# Patient Record
Sex: Female | Born: 1935 | Race: Black or African American | Hispanic: No | Smoking: Never smoker
Health system: Southern US, Community
[De-identification: ages and names within clinical notes are randomized; demographics above are authoritative.]

## PROBLEM LIST (undated history)

## (undated) DIAGNOSIS — F0390 Unspecified dementia without behavioral disturbance: Secondary | ICD-10-CM

## (undated) DIAGNOSIS — I209 Angina pectoris, unspecified: Secondary | ICD-10-CM

## (undated) DIAGNOSIS — I219 Acute myocardial infarction, unspecified: Secondary | ICD-10-CM

## (undated) DIAGNOSIS — K5792 Diverticulitis of intestine, part unspecified, without perforation or abscess without bleeding: Secondary | ICD-10-CM

## (undated) DIAGNOSIS — K219 Gastro-esophageal reflux disease without esophagitis: Secondary | ICD-10-CM

## (undated) DIAGNOSIS — I10 Essential (primary) hypertension: Secondary | ICD-10-CM

## (undated) DIAGNOSIS — F209 Schizophrenia, unspecified: Secondary | ICD-10-CM

## (undated) DIAGNOSIS — J449 Chronic obstructive pulmonary disease, unspecified: Secondary | ICD-10-CM

## (undated) HISTORY — PX: CARDIAC CATHETERIZATION: SHX172

## (undated) HISTORY — PX: POLYPECTOMY: SHX149

## (undated) HISTORY — PX: CHOLECYSTECTOMY: SHX55

## (undated) HISTORY — PX: ABDOMINAL HYSTERECTOMY: SHX81

---

## 1998-08-05 ENCOUNTER — Encounter: Payer: Self-pay | Admitting: Emergency Medicine

## 1998-08-05 ENCOUNTER — Emergency Department (HOSPITAL_COMMUNITY): Admission: EM | Admit: 1998-08-05 | Discharge: 1998-08-05 | Payer: Self-pay | Admitting: Emergency Medicine

## 2003-05-24 ENCOUNTER — Encounter: Admission: RE | Admit: 2003-05-24 | Discharge: 2003-05-24 | Payer: Self-pay | Admitting: Internal Medicine

## 2003-05-24 ENCOUNTER — Encounter: Payer: Self-pay | Admitting: Internal Medicine

## 2004-12-08 ENCOUNTER — Emergency Department (HOSPITAL_COMMUNITY): Admission: EM | Admit: 2004-12-08 | Discharge: 2004-12-08 | Payer: Self-pay | Admitting: Emergency Medicine

## 2006-04-17 IMAGING — CR DG CHEST 1V PORT
1 series · 1 of 1 positions shown · non-contrast
Comparison: None.

CLINICAL DATA: Chest pain. 
 CHEST PORTABLE ? 1 VIEW:

[view not recorded]
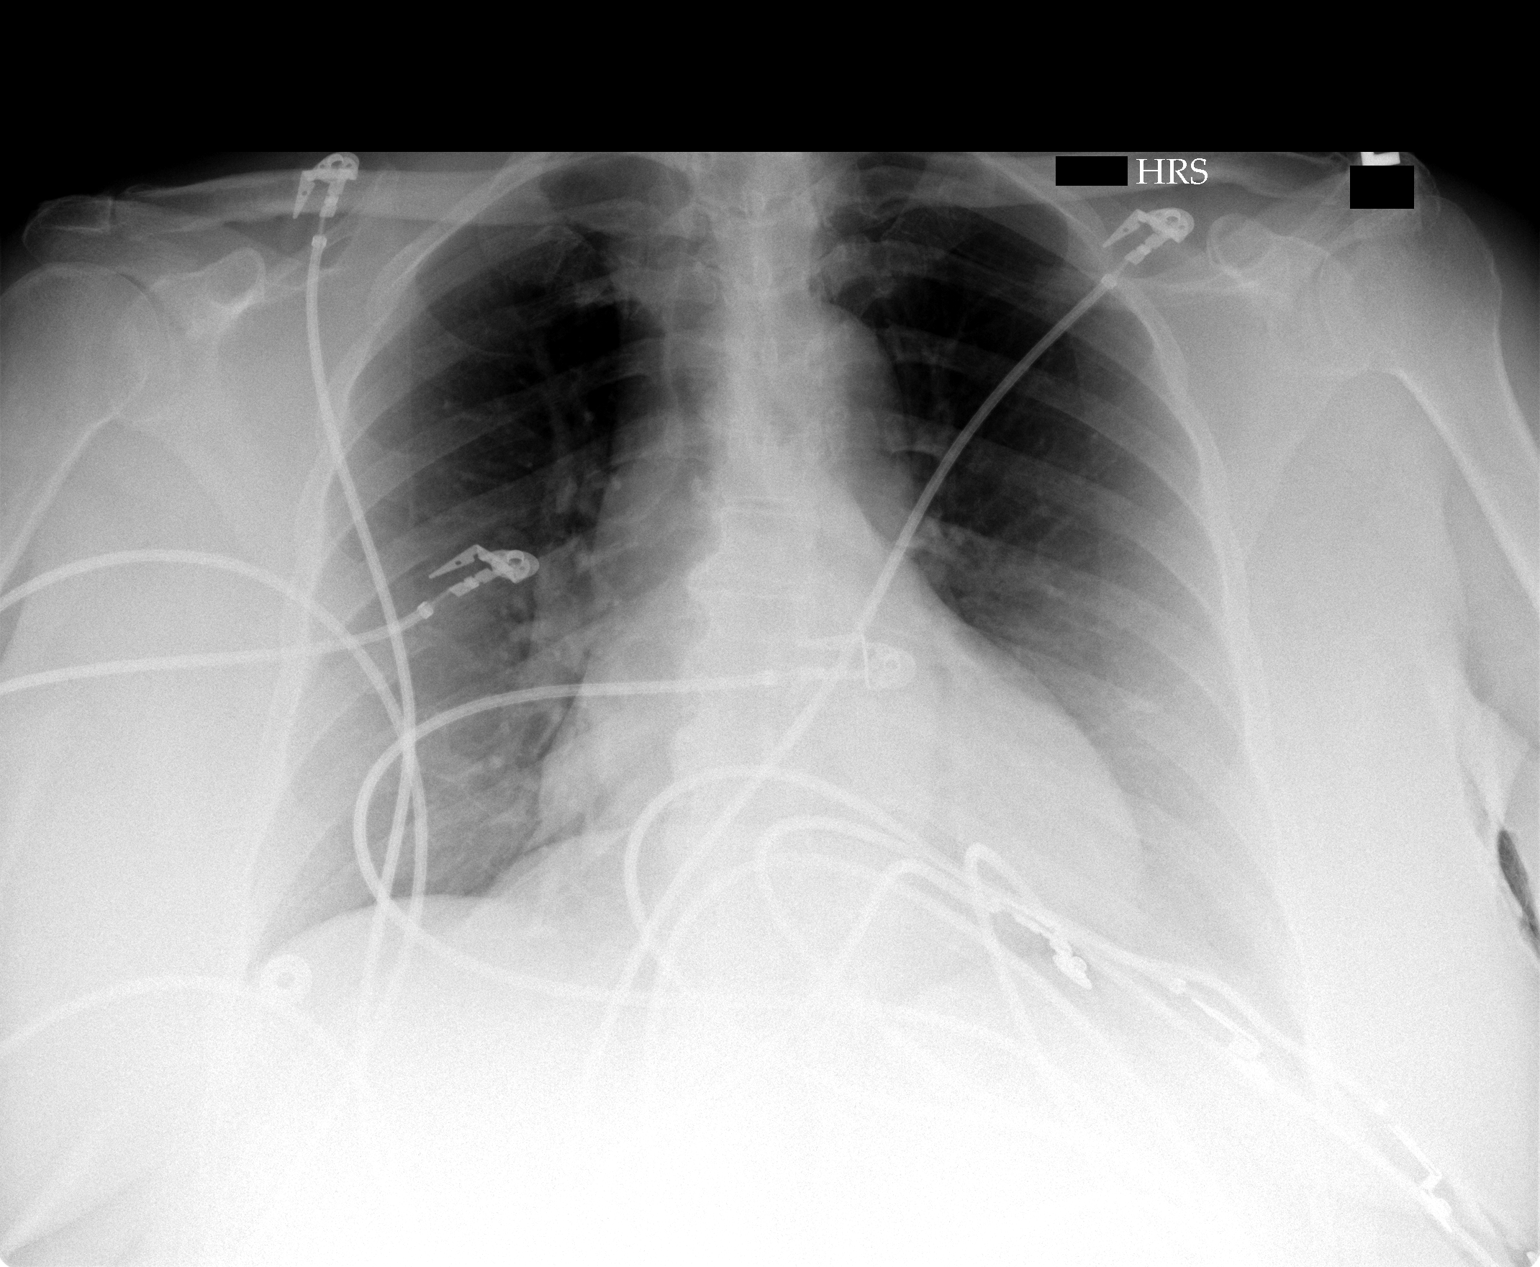

[1 of 1 positions shown; findings below may reference images not displayed]

FINDINGS: Heart appears to be within normal limits for portable technique.  No congestive heart failure or active disease.
IMPRESSION: Within normal limits for a portable chest x-ray.

## 2015-09-04 ENCOUNTER — Observation Stay (HOSPITAL_COMMUNITY)
Admission: EM | Admit: 2015-09-04 | Discharge: 2015-09-06 | Disposition: A | Discharge status: Home / Self Care | Payer: Medicare Other | Attending: Internal Medicine | Admitting: Internal Medicine

## 2015-09-04 ENCOUNTER — Emergency Department (HOSPITAL_COMMUNITY): Payer: Medicare Other

## 2015-09-04 ENCOUNTER — Encounter (HOSPITAL_COMMUNITY): Payer: Self-pay | Admitting: Vascular Surgery

## 2015-09-04 ENCOUNTER — Emergency Department (HOSPITAL_BASED_OUTPATIENT_CLINIC_OR_DEPARTMENT_OTHER): Payer: Medicare Other

## 2015-09-04 DIAGNOSIS — M79609 Pain in unspecified limb: Secondary | ICD-10-CM

## 2015-09-04 DIAGNOSIS — I10 Essential (primary) hypertension: Secondary | ICD-10-CM | POA: Insufficient documentation

## 2015-09-04 DIAGNOSIS — R0789 Other chest pain: Secondary | ICD-10-CM | POA: Diagnosis not present

## 2015-09-04 DIAGNOSIS — R1319 Other dysphagia: Secondary | ICD-10-CM | POA: Diagnosis not present

## 2015-09-04 DIAGNOSIS — K5792 Diverticulitis of intestine, part unspecified, without perforation or abscess without bleeding: Secondary | ICD-10-CM | POA: Insufficient documentation

## 2015-09-04 DIAGNOSIS — I209 Angina pectoris, unspecified: Secondary | ICD-10-CM | POA: Diagnosis not present

## 2015-09-04 DIAGNOSIS — M7989 Other specified soft tissue disorders: Secondary | ICD-10-CM

## 2015-09-04 DIAGNOSIS — I252 Old myocardial infarction: Secondary | ICD-10-CM | POA: Diagnosis not present

## 2015-09-04 DIAGNOSIS — R05 Cough: Secondary | ICD-10-CM | POA: Diagnosis not present

## 2015-09-04 DIAGNOSIS — R0602 Shortness of breath: Secondary | ICD-10-CM | POA: Diagnosis not present

## 2015-09-04 DIAGNOSIS — R600 Localized edema: Secondary | ICD-10-CM | POA: Diagnosis not present

## 2015-09-04 DIAGNOSIS — R059 Cough, unspecified: Secondary | ICD-10-CM | POA: Insufficient documentation

## 2015-09-04 DIAGNOSIS — I2510 Atherosclerotic heart disease of native coronary artery without angina pectoris: Secondary | ICD-10-CM | POA: Diagnosis not present

## 2015-09-04 DIAGNOSIS — R079 Chest pain, unspecified: Secondary | ICD-10-CM | POA: Diagnosis present

## 2015-09-04 DIAGNOSIS — K219 Gastro-esophageal reflux disease without esophagitis: Secondary | ICD-10-CM | POA: Insufficient documentation

## 2015-09-04 DIAGNOSIS — F0390 Unspecified dementia without behavioral disturbance: Secondary | ICD-10-CM | POA: Diagnosis not present

## 2015-09-04 DIAGNOSIS — R071 Chest pain on breathing: Secondary | ICD-10-CM

## 2015-09-04 DIAGNOSIS — R2243 Localized swelling, mass and lump, lower limb, bilateral: Secondary | ICD-10-CM | POA: Insufficient documentation

## 2015-09-04 DIAGNOSIS — Z880 Allergy status to penicillin: Secondary | ICD-10-CM | POA: Insufficient documentation

## 2015-09-04 DIAGNOSIS — M940 Chondrocostal junction syndrome [Tietze]: Secondary | ICD-10-CM

## 2015-09-04 DIAGNOSIS — F0280 Dementia in other diseases classified elsewhere without behavioral disturbance: Secondary | ICD-10-CM

## 2015-09-04 DIAGNOSIS — F411 Generalized anxiety disorder: Secondary | ICD-10-CM

## 2015-09-04 DIAGNOSIS — I509 Heart failure, unspecified: Secondary | ICD-10-CM

## 2015-09-04 DIAGNOSIS — G3183 Dementia with Lewy bodies: Secondary | ICD-10-CM

## 2015-09-04 HISTORY — DX: Gastro-esophageal reflux disease without esophagitis: K21.9

## 2015-09-04 HISTORY — DX: Unspecified dementia, unspecified severity, without behavioral disturbance, psychotic disturbance, mood disturbance, and anxiety: F03.90

## 2015-09-04 HISTORY — DX: Essential (primary) hypertension: I10

## 2015-09-04 HISTORY — DX: Angina pectoris, unspecified: I20.9

## 2015-09-04 HISTORY — DX: Diverticulitis of intestine, part unspecified, without perforation or abscess without bleeding: K57.92

## 2015-09-04 HISTORY — DX: Acute myocardial infarction, unspecified: I21.9

## 2015-09-04 LAB — BASIC METABOLIC PANEL
ANION GAP: 6 (ref 5–15)
BUN: 10 mg/dL (ref 6–20)
CALCIUM: 8.8 mg/dL — AB (ref 8.9–10.3)
CHLORIDE: 107 mmol/L (ref 101–111)
CO2: 28 mmol/L (ref 22–32)
Creatinine, Ser: 0.98 mg/dL (ref 0.44–1.00)
GFR calc non Af Amer: 53 mL/min — ABNORMAL LOW (ref >60)
Glucose, Bld: 111 mg/dL — ABNORMAL HIGH (ref 65–99)
POTASSIUM: 4 mmol/L (ref 3.5–5.1)
Sodium: 141 mmol/L (ref 135–145)

## 2015-09-04 LAB — URINALYSIS, ROUTINE W REFLEX MICROSCOPIC
Glucose, UA: NEGATIVE mg/dL
Hgb urine dipstick: NEGATIVE
KETONES UR: NEGATIVE mg/dL
LEUKOCYTES UA: NEGATIVE
NITRITE: NEGATIVE
PH: 5.5 (ref 5.0–8.0)
PROTEIN: NEGATIVE mg/dL
Specific Gravity, Urine: 1.025 (ref 1.005–1.030)

## 2015-09-04 LAB — CBC
HEMATOCRIT: 39.3 % (ref 36.0–46.0)
Hemoglobin: 13 g/dL (ref 12.0–15.0)
MCH: 32.4 pg (ref 26.0–34.0)
MCHC: 33.1 g/dL (ref 30.0–36.0)
MCV: 98 fL (ref 78.0–100.0)
Platelets: 241 10*3/uL (ref 150–400)
RBC: 4.01 MIL/uL (ref 3.87–5.11)
RDW: 15.7 % — AB (ref 11.5–15.5)
WBC: 5.5 10*3/uL (ref 4.0–10.5)

## 2015-09-04 LAB — TROPONIN I

## 2015-09-04 LAB — I-STAT TROPONIN, ED: Troponin i, poc: 0 ng/mL (ref 0.00–0.08)

## 2015-09-04 LAB — BRAIN NATRIURETIC PEPTIDE: B Natriuretic Peptide: 58.7 pg/mL (ref 0.0–100.0)

## 2015-09-04 MED ORDER — GUAIFENESIN ER 600 MG PO TB12
600.0000 mg | ORAL_TABLET | Freq: Two times a day (BID) | ORAL | Status: DC
  Administered 2015-09-04 – 2015-09-06 (×4): 600 mg via ORAL
  Filled 2015-09-04 (×4): qty 1

## 2015-09-04 MED ORDER — ASPIRIN EC 81 MG PO TBEC
81.0000 mg | DELAYED_RELEASE_TABLET | Freq: Every day | ORAL | Status: DC
  Administered 2015-09-05 – 2015-09-06 (×2): 81 mg via ORAL
  Filled 2015-09-04 (×3): qty 1

## 2015-09-04 MED ORDER — ALPRAZOLAM 0.5 MG PO TABS
1.0000 mg | ORAL_TABLET | Freq: Two times a day (BID) | ORAL | Status: DC | PRN
  Administered 2015-09-05 – 2015-09-06 (×3): 1 mg via ORAL
  Filled 2015-09-04 (×3): qty 2

## 2015-09-04 MED ORDER — ADULT MULTIVITAMIN W/MINERALS CH
1.0000 | ORAL_TABLET | Freq: Every day | ORAL | Status: DC
  Administered 2015-09-05 – 2015-09-06 (×2): 1 via ORAL
  Filled 2015-09-04 (×2): qty 1

## 2015-09-04 MED ORDER — DAILY VITE PO TABS: 1.0000 | ORAL_TABLET | Freq: Every day | ORAL | Status: DC

## 2015-09-04 MED ORDER — ENOXAPARIN SODIUM 40 MG/0.4ML ~~LOC~~ SOLN
40.0000 mg | SUBCUTANEOUS | Status: DC
  Administered 2015-09-04 – 2015-09-05 (×2): 40 mg via SUBCUTANEOUS
  Filled 2015-09-04 (×2): qty 0.4

## 2015-09-04 MED ORDER — TRAZODONE HCL 100 MG PO TABS
100.0000 mg | ORAL_TABLET | Freq: Every day | ORAL | Status: DC
  Administered 2015-09-04 – 2015-09-05 (×2): 100 mg via ORAL
  Filled 2015-09-04 (×2): qty 1

## 2015-09-04 MED ORDER — MORPHINE SULFATE (PF) 2 MG/ML IV SOLN
1.0000 mg | INTRAVENOUS | Status: DC | PRN
  Administered 2015-09-04: 1 mg via INTRAVENOUS
  Filled 2015-09-04: qty 1

## 2015-09-04 MED ORDER — ONDANSETRON HCL 4 MG/2ML IJ SOLN: 4.0000 mg | Freq: Four times a day (QID) | INTRAMUSCULAR | Status: DC | PRN

## 2015-09-04 MED ORDER — FUROSEMIDE 10 MG/ML IJ SOLN
20.0000 mg | Freq: Four times a day (QID) | INTRAMUSCULAR | Status: DC
  Administered 2015-09-05 – 2015-09-06 (×5): 20 mg via INTRAVENOUS
  Filled 2015-09-04 (×6): qty 2

## 2015-09-04 MED ORDER — ESTROGENS CONJUGATED 0.625 MG PO TABS
0.6250 mg | ORAL_TABLET | Freq: Every day | ORAL | Status: DC
  Administered 2015-09-05: 0.625 mg via ORAL
  Filled 2015-09-04 (×3): qty 1

## 2015-09-04 MED ORDER — MORPHINE SULFATE (PF) 4 MG/ML IV SOLN
4.0000 mg | Freq: Once | INTRAVENOUS | Status: AC
  Administered 2015-09-04: 4 mg via INTRAVENOUS
  Filled 2015-09-04: qty 1

## 2015-09-04 MED ORDER — FUROSEMIDE 10 MG/ML IJ SOLN
20.0000 mg | Freq: Once | INTRAMUSCULAR | Status: AC
  Administered 2015-09-04: 20 mg via INTRAVENOUS
  Filled 2015-09-04: qty 2

## 2015-09-04 MED ORDER — POTASSIUM CHLORIDE CRYS ER 10 MEQ PO TBCR
10.0000 meq | EXTENDED_RELEASE_TABLET | Freq: Every day | ORAL | Status: DC
  Administered 2015-09-05 – 2015-09-06 (×2): 10 meq via ORAL
  Filled 2015-09-04 (×3): qty 1

## 2015-09-04 MED ORDER — BENZONATATE 100 MG PO CAPS
200.0000 mg | ORAL_CAPSULE | ORAL | Status: AC
  Administered 2015-09-04: 200 mg via ORAL
  Filled 2015-09-04: qty 2

## 2015-09-04 MED ORDER — MONTELUKAST SODIUM 10 MG PO TABS
10.0000 mg | ORAL_TABLET | Freq: Every day | ORAL | Status: DC
  Administered 2015-09-04 – 2015-09-05 (×2): 10 mg via ORAL
  Filled 2015-09-04 (×2): qty 1

## 2015-09-04 MED ORDER — IPRATROPIUM-ALBUTEROL 0.5-2.5 (3) MG/3ML IN SOLN
3.0000 mL | RESPIRATORY_TRACT | Status: DC | PRN
  Filled 2015-09-04: qty 3

## 2015-09-04 MED ORDER — OXYBUTYNIN CHLORIDE 5 MG PO TABS
5.0000 mg | ORAL_TABLET | Freq: Every day | ORAL | Status: DC
  Administered 2015-09-05 – 2015-09-06 (×2): 5 mg via ORAL
  Filled 2015-09-04 (×2): qty 1

## 2015-09-04 MED ORDER — ATORVASTATIN CALCIUM 20 MG PO TABS
20.0000 mg | ORAL_TABLET | Freq: Every evening | ORAL | Status: DC
  Administered 2015-09-04 – 2015-09-05 (×2): 20 mg via ORAL
  Filled 2015-09-04 (×2): qty 1

## 2015-09-04 MED ORDER — IPRATROPIUM-ALBUTEROL 0.5-2.5 (3) MG/3ML IN SOLN
3.0000 mL | Freq: Three times a day (TID) | RESPIRATORY_TRACT | Status: DC
  Administered 2015-09-05 – 2015-09-06 (×4): 3 mL via RESPIRATORY_TRACT
  Filled 2015-09-04 (×5): qty 3

## 2015-09-04 MED ORDER — BENZONATATE 100 MG PO CAPS
200.0000 mg | ORAL_CAPSULE | Freq: Two times a day (BID) | ORAL | Status: DC
  Administered 2015-09-04 – 2015-09-06 (×4): 200 mg via ORAL
  Filled 2015-09-04 (×4): qty 2

## 2015-09-04 MED ORDER — HYDROCODONE-ACETAMINOPHEN 5-325 MG PO TABS
1.0000 | ORAL_TABLET | Freq: Four times a day (QID) | ORAL | Status: DC | PRN
  Administered 2015-09-04 – 2015-09-05 (×3): 1 via ORAL
  Filled 2015-09-04 (×3): qty 1

## 2015-09-04 MED ORDER — DICLOFENAC SODIUM 1 % TD GEL
4.0000 g | Freq: Four times a day (QID) | TRANSDERMAL | Status: DC
  Administered 2015-09-04 – 2015-09-06 (×6): 4 g via TOPICAL
  Filled 2015-09-04: qty 100

## 2015-09-04 MED ORDER — IPRATROPIUM-ALBUTEROL 0.5-2.5 (3) MG/3ML IN SOLN
3.0000 mL | Freq: Four times a day (QID) | RESPIRATORY_TRACT | Status: DC
  Administered 2015-09-04: 3 mL via RESPIRATORY_TRACT

## 2015-09-04 MED ORDER — FOLIC ACID 1 MG PO TABS
1.0000 mg | ORAL_TABLET | Freq: Every day | ORAL | Status: DC
  Administered 2015-09-05 – 2015-09-06 (×2): 1 mg via ORAL
  Filled 2015-09-04 (×2): qty 1

## 2015-09-04 MED ORDER — ASPIRIN 325 MG PO TABS
325.0000 mg | ORAL_TABLET | Freq: Once | ORAL | Status: AC
  Administered 2015-09-04: 325 mg via ORAL
  Filled 2015-09-04: qty 1

## 2015-09-04 MED ORDER — MELOXICAM 7.5 MG PO TABS
7.5000 mg | ORAL_TABLET | Freq: Every day | ORAL | Status: DC
  Administered 2015-09-05: 7.5 mg via ORAL
  Filled 2015-09-04: qty 1

## 2015-09-04 MED ORDER — TAMSULOSIN HCL 0.4 MG PO CAPS
0.4000 mg | ORAL_CAPSULE | Freq: Every day | ORAL | Status: DC
  Administered 2015-09-05: 0.4 mg via ORAL
  Filled 2015-09-04: qty 1

## 2015-09-04 MED ORDER — GI COCKTAIL ~~LOC~~
30.0000 mL | Freq: Four times a day (QID) | ORAL | Status: DC | PRN
  Administered 2015-09-04: 30 mL via ORAL
  Filled 2015-09-04: qty 30

## 2015-09-04 MED ORDER — PANTOPRAZOLE SODIUM 40 MG PO TBEC
40.0000 mg | DELAYED_RELEASE_TABLET | Freq: Every day | ORAL | Status: DC
  Administered 2015-09-05 – 2015-09-06 (×2): 40 mg via ORAL
  Filled 2015-09-04 (×2): qty 1

## 2015-09-04 MED ORDER — ACETAMINOPHEN 325 MG PO TABS: 650.0000 mg | ORAL_TABLET | ORAL | Status: DC | PRN

## 2015-09-04 NOTE — ED Notes (Signed)
Admitting at bedside 

## 2015-09-04 NOTE — ED Notes (Signed)
Pt provided with meal

## 2015-09-04 NOTE — ED Notes (Signed)
Pt reports to the ED for eval of bilateral lower leg swelling that began yesterday. Pt reports some SOB with minimal exertion. Reports some midsternal chest pain that is intermittent in nature. Per daughter pt had a cardiac cath without stent placement x 6 weeks ago. Pts family denies any hx of CHF. Pt alert and oriented at baseline. Resp e/u and skin warm and dry.

## 2015-09-04 NOTE — ED Provider Notes (Signed)
CSN: [id number]     Arrival date & time 09/04/15  1457 History   First MD Initiated Contact with Patient 09/04/15 1704     Chief Complaint  Patient presents with  . Leg Swelling     (Consider location/radiation/quality/duration/timing/severity/associated sxs/prior Treatment) The history is provided by the patient.  Courtney Oneal is a 81 y.o. female hx of dementia, HTN, MI here with chest pain, shortness of breath, leg swelling. [place] came [place]. Patient states that she was in the hospital about 6 weeks ago and had a heart attack. Upon review of her records, patient had NSTEMI. Patient had a cardiac cath but no stents were placed. Patient came here 3 days ago. Has worsening chest pain for the last 2 days. Pain worse with exertion and she gets short of breath with exertion as well. Also worsening leg swelling bilaterally. No hx of DVT    Past Medical History  Diagnosis Date  . Dementia   . Hypertension   . MI (myocardial infarction) (HCC)   . GERD (gastroesophageal reflux disease)   . Diverticulitis   . Angina pectoris [facility](HCC)    Past Surgical History  Procedure Laterality Date  . Cardiac catheterization    . Cholecystectomy    . Abdominal hysterectomy    . Polypectomy     History reviewed. No pertinent family history. Social History  Substance Use Topics  . Smoking status: Never Smoker   . Smokeless tobacco: Never Used  . Alcohol Use: No   OB History    No data available     Review of Systems  Respiratory: Positive for shortness of breath.   Cardiovascular: Positive for chest pain.  All other systems reviewed and are negative.     Allergies  Penicillins and Sulfa antibiotics  Home Medications   Prior to Admission medications   Not on File   BP 120/76 mmHg  Pulse 62  Temp(Src) 97.9 F (36.6 C) (Oral)  Resp 17  SpO2 97% Physical Exam  Constitutional: She is oriented to person, place, and time.  Chronically ill   HENT:  Head:  Normocephalic.  Mouth/Throat: Oropharynx is clear and moist.  Eyes: Conjunctivae are normal. Pupils are equal, round, and reactive to light.  Neck: Normal range of motion. Neck supple.  Cardiovascular: Normal rate, regular rhythm and normal heart sounds.   Pulmonary/Chest: Effort normal.  Diminished bilaterally, no wheezing   Abdominal: Soft. Bowel sounds are normal. She exhibits no distension. There is no tenderness. There is no rebound.  Musculoskeletal:  2+ edema bilaterally, bilateral calf tenderness   Neurological: She is alert and oriented to person, place, and time.  Skin: Skin is warm and dry.  Psychiatric: She has a normal mood and affect. Her behavior is normal. Judgment and thought content normal.  Nursing note and vitals reviewed.   ED Course  Procedures (including critical care time) Labs Review Labs Reviewed  BASIC METABOLIC PANEL - Abnormal; Notable for the following:    Glucose, Bld 111 (*)    Calcium 8.8 (*)    GFR calc non Af Amer 53 (*)    All other components within normal limits  CBC - Abnormal; Notable for the following:    RDW 15.7 (*)    All other components within normal limits  URINALYSIS, ROUTINE W REFLEX MICROSCOPIC (NOT AT [facility]) - Abnormal; Notable for the following:    Color, Urine AMBER (*)    Bilirubin Urine SMALL (*)    All other  components within normal limits  BRAIN NATRIURETIC PEPTIDE  I-STAT TROPOININ, ED    Imaging Review Dg Chest 2 View  09/04/2015  CLINICAL DATA:  Shortness of breath for 2 days.  Initial encounter. EXAM: CHEST  2 VIEW COMPARISON:  Single view of the chest 12/08/2004. PA and lateral chest 05/24/2003. FINDINGS: There is cardiomegaly without edema. The lungs are clear. No pneumothorax or pleural effusion. IMPRESSION: Cardiomegaly without acute disease. Electronically Signed   By: Drusilla Kanner M.D.   On: 09/04/2015 16:09   I have personally reviewed and evaluated these images and lab results as part of my medical  decision-making.   EKG Interpretation   Date/Time:  Sunday September 04 2015 15:13:50 EST Ventricular Rate:  64 PR Interval:  208 QRS Duration: 94 QT Interval:  434 QTC Calculation: 447 R Axis:   -77 Text Interpretation:  Sinus rhythm with Premature atrial complexes with  Abberant conduction Incomplete right bundle branch block Left anterior  fascicular block Septal infarct , age undetermined Abnormal ECG No  significant change since last tracing Confirmed by YAO  MD, DAVID ([id number])  on 09/04/2015 5:25:27 PM      MDM   Final diagnoses:  None    Courtney Oneal is a 81 y.o. female here with chest pain, shortness of breath, leg swelling. Has hx of NSTEMI and CHF. Will get trop, BNP,CXR. Will likely admit.   6:41 PM BNP nl. No DVT on [place]. CXR showed no pulmonary edema. Consulted Dr. Lisabeth Devoid, cardiology, who will see patient. Recommend admission to medical service. Will admit to tele for rule out.      Richardean Canal, MD 09/04/15 [id number]

## 2015-09-04 NOTE — H&P (Signed)
[facility] History and Physical  Courtney Oneal ZOX:[id number] DOB: [birth year] DOA: 09/04/2015  Referring physician:ED PCP: No primary care provider on file.   Chief Complaint: Bilateral leg swelling  HPI:  Courtney Oneal is a 81 year old female with a past medical history significant for HTN, HLD, CAD, s/p and NSTEMI 6 weeks ago; who presented to from [facility] nursing home and [place] for the holidays and presents with complaints of leg swelling and pain as well as chest discomfort. Daughter states that she went to pick her up and there was about a 2-1/2-3 hour drive. When she got her from the nursing facility. Patient noted increasing bilateral leg swelling and pain. She reports pain in her chest that wraps around her chest underneath her breasts and into her back. She was given Voltaren gel to help relieve symptoms prior to arrival. She reports some mild relief with this. Reports coughing thick sputum for the last 2 weeks. Was given a 4 day trial of antibiotics without relief of symptoms reported intermittent fever of 48F to 100F.  Upon admission into the emergency department patient was checked with a venous duplex Doppler ultrasound and found to have no signs of a DVT.  Review of Systems  Constitutional: Positive for malaise/fatigue. Negative for weight loss.  HENT: Negative for ear discharge and ear pain.   Eyes: Negative for photophobia and pain.  Respiratory: Positive for cough, sputum production and shortness of breath.   Cardiovascular: Positive for chest pain and leg swelling.  Gastrointestinal: Negative for vomiting and abdominal pain.  Genitourinary: Negative for urgency and frequency.  Musculoskeletal: Positive for myalgias and joint pain.  Skin: Negative for itching and rash.  Neurological: Negative for sensory change and focal weakness.  Endo/Heme/Allergies: Negative for environmental allergies and polydipsia.  Psychiatric/Behavioral: Negative for suicidal ideas and  substance abuse.     Past Medical History  Diagnosis Date  . Dementia   . Hypertension   . MI (myocardial infarction) (HCC)   . GERD (gastroesophageal reflux disease)   . Diverticulitis   . Angina pectoris [facility])      Past Surgical History  Procedure Laterality Date  . Cardiac catheterization    . Cholecystectomy    . Abdominal hysterectomy    . Polypectomy        Social History:  reports that she has never smoked. She has never used smokeless tobacco. She reports that she does not drink alcohol or use illicit drugs. Where does patient live--SNF  Allergies  Allergen Reactions  . Penicillins Hives  . Sulfa Antibiotics Hives    History reviewed. No pertinent family history.      Prior to Admission medications   Not on File     Physical Exam: Filed Vitals:   09/04/15 1800 09/04/15 1830 09/04/15 1900 09/04/15 1930  BP: 120/93 114/60 116/54 107/47  Pulse: 64 64 60 60  Temp:      TempSrc:      Resp: SpO2: 94% 95% 95% 93%     Constitutional: Vital signs reviewed. Patient is no acute distress and cooperative with exam. Alert and oriented x3.  Head: Normocephalic and atraumatic  Ear: TM normal bilaterally  Mouth: no erythema or exudates, MMM  Eyes: PERRL, EOMI, conjunctivae normal, No scleral icterus.  Neck: Supple, Trachea midline normal ROM, No JVD, mass, thyromegaly, or carotid bruit present.  Cardiovascular: RRR, S1 normal, S2 normal, no MRG, pulses symmetric and intact bilaterally  Pulmonary/Chest: Mild expiratory wheezes appreciated. tenderness to  palpation of the chest wall reproducing pain Abdominal: Soft. Non-tender, non-distended, bowel sounds are normal, no masses, organomegaly, or guarding present.  GU: no CVA tenderness Musculoskeletal: No joint deformities, erythema, or stiffness, ROM full and no nontender Ext: 3+ pitting edema on the bilateral lower extremities and no cyanosis, pulses palpable bilaterally (DP and PT)  Hematology: no  cervical, inginal, or axillary adenopathy.  Neurological: A&O x3, Strenght is normal and symmetric bilaterally, cranial nerve II-XII are grossly intact, no focal motor deficit, sensory intact to light touch bilaterally.  Skin: Warm, dry and intact. No rash, cyanosis, or clubbing.  Psychiatric: Normal mood and affect. speech and behavior is normal. Judgment and thought content normal. Cognition and memory at least appear to be normal at this time although reported history of dementia      Data Review   Micro Results No results found for this or any previous visit (from the past 240 hour(s)).  Radiology Reports Dg Chest 2 View  09/04/2015  CLINICAL DATA:  Shortness of breath for 2 days.  Initial encounter. EXAM: CHEST  2 VIEW COMPARISON:  Single view of the chest 12/08/2004. PA and lateral chest 05/24/2003. FINDINGS: There is cardiomegaly without edema. The lungs are clear. No pneumothorax or pleural effusion. IMPRESSION: Cardiomegaly without acute disease. Electronically Signed   By: Drusilla Kanner M.D.   On: 09/04/2015 16:09     CBC  Recent Labs Lab 09/04/15 1517  WBC 5.5  HGB 13.0  HCT 39.3  PLT 241  MCV 98.0  MCH 32.4  MCHC 33.1  RDW 15.7*    Chemistries   Recent Labs Lab 09/04/15 1517  NA 141  K 4.0  CL 107  CO2 28  GLUCOSE 111*  BUN 10  CREATININE 0.98  CALCIUM 8.8*   ------------------------------------------------------------------------------------------------------------------ CrCl cannot be calculated (Unknown ideal weight.). ------------------------------------------------------------------------------------------------------------------ No results for input(s): HGBA1C in the last 72 hours. ------------------------------------------------------------------------------------------------------------------ No results for input(s): CHOL, HDL, LDLCALC, TRIG, CHOLHDL, LDLDIRECT in the last 72  hours. ------------------------------------------------------------------------------------------------------------------ No results for input(s): TSH, T4TOTAL, T3FREE, THYROIDAB in the last 72 hours.  Invalid input(s): FREET3 ------------------------------------------------------------------------------------------------------------------ No results for input(s): VITAMINB12, FOLATE, FERRITIN, TIBC, IRON, RETICCTPCT in the last 72 hours.  Coagulation profile No results for input(s): INR, PROTIME in the last 168 hours.  No results for input(s): DDIMER in the last 72 hours.  Cardiac Enzymes No results for input(s): CKMB, TROPONINI, MYOGLOBIN in the last 168 hours.  Invalid input(s): CK ------------------------------------------------------------------------------------------------------------------ Invalid input(s): POCBNP   CBG: No results for input(s): GLUCAP in the last 168 hours.     EKG: Independently reviewed. Sinus rhythm bifascicular block   Assessment/Plan Active Problems:   Chest pain:  Likely secondary to costochondritis patient describes a pain surrounding her entire chest wall pain reproducible physical exam. Initial troponin is negative EKG showing sinus rhythm with a bifascicular block - Admit to telemetry - Continue trending cardiac enzymes 3 - Cards consultation appreciated -  Mobic   Lower extremity edema bilaterally: Acute. Patient with 3+ pitting edema on physical exam. No signs of a DVT on ultrasound. - Would likely give trial of Lasix - Holding hypertensive medications during diuresis to maintain blood pressures - Compression stocking  Bronchitis - Continue Singulair - DuoNeb's - Mucinex - Tessalon pearls for cough suppression at night   Dysphagia -  Therapy speech therapy to eval and treat  Hyperlipidemia - Continue atorvastatin  Hypertension - Held amlodipine for diuresis as blood pressure low normal  GERD -Therapeutic substitution  for Pepcid with  Protonix  Anxiety - Continue home medications of Xanax as needed  Code Status:   full Family Communication: bedside Disposition Plan: admit   Total time spent 55 minutes.Greater than 50% of this time was spent in counseling, explanation of diagnosis, planning of further management, and coordination of care  Clydie BraunRondell A Luisenrique Conran [facility] Pager [phone]  If 7PM-7AM, please contact night-coverage [email or web address] Password [email or web address] 09/04/2015, 8:10 PM

## 2015-09-04 NOTE — ED Notes (Signed)
Attempted to call report

## 2015-09-04 NOTE — Progress Notes (Signed)
VASCULAR LAB PRELIMINARY  PRELIMINARY  PRELIMINARY  PRELIMINARY  Bilateral lower extremity venous duplex completed.    Preliminary report:  Bilateral:  No evidence of DVT, superficial thrombosis, or Baker's Cyst.   Marcos Ruelas, RVS 09/04/2015, 6:41 PM

## 2015-09-04 NOTE — ED Notes (Signed)
US at bedside

## 2015-09-04 NOTE — Consult Note (Signed)
CARDIOLOGY INPATIENT CONSULTATION NOTE  Patient ID: Courtney Oneal MRN: [id number], DOB/AGE: 79/20/1935   Admit date: 09/04/2015   Primary Physician: No primary care provider on file. Primary Cardiologist: Dr. Harriette Bouillon in [place]   Reason for Consult:   Chest pain   Requesting Physician: Chaney Malling MD  HPI: This is a 81 y.o.AA female with prior history of MI (>10 years ago), CVA >10 years ago, HTN, GERD, diverticulitis, nursing home resident (for last 6 months) who presented with an episode of chest pain. Patient is admitted under [facility] and is being ruled out for ACS.   Patient was in his usual state of health 2 days ago when she developed chest pain. She lives in [place] and came down here 3 days ago. She was also recently admitted in [place] 6 weeks ago with the same complaints. At that time she was evaluated and ruled out for ACS. She underwent cardiac catheterization and did not receive PCI. She was told that she did not have a heart attack at that time.  Patient said that she has been having a viral infection for >1 month and has been coughing for the same amount of time. She has PNA or bronchitis every year around this time.  She is descrbing chest pain as substernal in location, burning in nature, goes up and down the midline, 10/10 in intensity, goes to the back and under both arms. The chest pain comes with spicy foods. It is associated with SOB, leg edema. Her BNP is normal and CXR without pulmonary edema. Patient was given aspirin in th ED. Her DVT leg study was negative. Patient has also mechanical dysphagia to solid foods. She also has nausea.   Problem List: Past Medical History  Diagnosis Date  . Dementia   . Hypertension       . GERD (gastroesophageal reflux disease)   . Diverticulitis   . Angina pectoris [facility])     Past Surgical History  Procedure Laterality Date  . Cardiac catheterization    . Cholecystectomy    . Abdominal hysterectomy     . Polypectomy       Allergies:  Allergies  Allergen Reactions  . Penicillins Hives  . Sulfa Antibiotics Hives     Home Medications Current Facility-Administered Medications  Medication Dose Route Frequency Provider Last Rate Last Dose  . benzonatate (TESSALON) capsule 200 mg  200 mg Oral STAT Clydie Braun, MD       No current outpatient prescriptions on file.     Family History  Problem Relation Age of Onset  . Hypertension Mother   . Hypertension Father   . Cancer Mother   . Cancer Father   . Cancer - Lung Sister   . Cancer - Colon Sister   . Cancer - Other Son      Social History   Social History  . Marital Status: Widowed    Spouse Name: N/A  . Number of Children: N/A  . Years of Education: N/A   Occupational History  . nursing home resident    Social History Main Topics  . Smoking status: Never Smoker   . Smokeless tobacco: Never Used  . Alcohol Use: No  . Drug Use: No  . Sexual Activity: Not on file   Other Topics Concern  . Not on file   Social History Narrative     Review of Systems: General: fatigue increase weight negative for chills, fever, night sweats  Cardiovascular: leg edema, dyspnea  on exertion  Dermatological: negative for rash Respiratory: productive cough and SOB negative for wheezing  Urologic: dysuria  Abdominal: nausea and has left sided hernia negative for vomiting, diarrhea, bright red blood per rectum, melena, or hematemesis Neurologic: uses walker and a wheelchair to move around  Hematology: anemia Psychiatry: non suicidal/homicidal  Musculoskeletal: back pain   Physical Exam: Vitals: BP 107/47 mmHg  Pulse 60  Temp(Src) 97.9 F (36.6 C) (Oral)  Resp 15  SpO2 93% General: not in acute distress Neck: JVP elevated to jaw, decreased range of motion of the neck  Heart: regular rate and rhythm, S1, S2, no murmurs  Lungs: CTAB  Chest: tenderness on claviculosternal joints bilaterally, tender on costosternal  joints GI: non tender, non distended, bowel sounds present Extremities: no edema Neuro: AAO x 3  Psych: normal affect, no anxiety   Labs:   Results for orders placed or performed during the hospital encounter of 09/04/15 (from the past 24 hour(s))  Basic metabolic panel     Status: Abnormal   Collection Time: 09/04/15  3:17 PM  Result Value Ref Range   Sodium 141 135 - 145 mmol/L   Potassium 4.0 3.5 - 5.1 mmol/L   Chloride 107 101 - 111 mmol/L   CO2 28 22 - 32 mmol/L   Glucose, Bld 111 (H) 65 - 99 mg/dL   BUN 10 6 - 20 mg/dL   Creatinine, Ser 4.090.98 0.44 - 1.00 mg/dL   Calcium 8.8 (L) 8.9 - 10.3 mg/dL   GFR calc non Af Amer 53 (L) >60 mL/min   GFR calc Af Amer >60 >60 mL/min   Anion gap 6 5 - 15  CBC     Status: Abnormal   Collection Time: 09/04/15  3:17 PM  Result Value Ref Range   WBC 5.5 4.0 - 10.5 K/uL   RBC 4.01 3.87 - 5.11 MIL/uL   Hemoglobin 13.0 12.0 - 15.0 g/dL   HCT 81.139.3 91.436.0 - 78.246.0 %   MCV 98.0 78.0 - 100.0 fL   MCH 32.4 26.0 - 34.0 pg   MCHC 33.1 30.0 - 36.0 g/dL   RDW 95.615.7 (H) 21.311.5 - 08.615.5 %   Platelets 241 150 - 400 K/uL  Brain natriuretic peptide     Status: None   Collection Time: 09/04/15  3:17 PM  Result Value Ref Range   B Natriuretic Peptide 58.7 0.0 - 100.0 pg/mL  Urinalysis, Routine w reflex microscopic-may I&O cath if menses (not at [facility])     Status: Abnormal   Collection Time: 09/04/15  3:20 PM  Result Value Ref Range   Color, Urine AMBER (A) YELLOW   APPearance CLEAR CLEAR   Specific Gravity, Urine 1.025 1.005 - 1.030   pH 5.5 5.0 - 8.0   Glucose, UA NEGATIVE NEGATIVE mg/dL   Hgb urine dipstick NEGATIVE NEGATIVE   Bilirubin Urine SMALL (A) NEGATIVE   Ketones, ur NEGATIVE NEGATIVE mg/dL   Protein, ur NEGATIVE NEGATIVE mg/dL   Nitrite NEGATIVE NEGATIVE   Leukocytes, UA NEGATIVE NEGATIVE  I-stat troponin, ED (not at [facility], [facility])     Status: None   Collection Time: 09/04/15  3:40 PM  Result Value Ref Range   Troponin i, poc 0.00 0.00 - 0.08  ng/mL   Comment 3             Radiology/Studies: Dg Chest 2 View  09/04/2015  CLINICAL DATA:  Shortness of breath for 2 days.  Initial encounter. EXAM: CHEST  2 VIEW COMPARISON:  Single  view of the chest 12/08/2004. PA and lateral chest 05/24/2003. FINDINGS: There is cardiomegaly without edema. The lungs are clear. No pneumothorax or pleural effusion. IMPRESSION: Cardiomegaly without acute disease. Electronically Signed   By: Drusilla Kanner M.D.   On: 09/04/2015 16:09    EKG: today normal sinus rhythm vent rate 64 bpm, PACs, incomplete RBBB, left anterior fascicular block, septal infarct  Echo: not available  Cardiac cath: not available  Medical decision making:  Discussed care with the patient and daughter Discussed care with the physician  Reviewed labs and imaging personally Reviewed prior records  ASSESSMENT AND PLAN:  This is a 81 y.o.AA female with prior history of MI (>10 years ago), CVA >10 years ago, HTN, GERD, diverticulitis, nursing home resident (for last 6 months) who presented with an episode of chest pain. Patient is admitted under [facility] hospitalist service and is being ruled out for ACS.    Active Problems:   Chest pain  Chest pain likely secondary to costochondritis - obtain cardiac cath report from outside - treat with short course of mobic x 10 days  Acute CHF - unknown LVEF, NYHA class III Obtain echo, IV diuresis, strict I/Os, daily weights, low sodium diet Hold BP meds to provide room for IV diuresis   Chronic CAD - continue aspirin, statin home dose   Dysphagia to solids - need further investigation w/barium swallow or GI consult  Signed, Joellyn Rued, MD MS 09/04/2015, 8:17 PM

## 2015-09-05 DIAGNOSIS — R072 Precordial pain: Secondary | ICD-10-CM

## 2015-09-05 DIAGNOSIS — R600 Localized edema: Secondary | ICD-10-CM | POA: Insufficient documentation

## 2015-09-05 DIAGNOSIS — M940 Chondrocostal junction syndrome [Tietze]: Secondary | ICD-10-CM | POA: Diagnosis not present

## 2015-09-05 DIAGNOSIS — K219 Gastro-esophageal reflux disease without esophagitis: Secondary | ICD-10-CM | POA: Diagnosis not present

## 2015-09-05 DIAGNOSIS — R079 Chest pain, unspecified: Secondary | ICD-10-CM | POA: Diagnosis not present

## 2015-09-05 DIAGNOSIS — R059 Cough, unspecified: Secondary | ICD-10-CM | POA: Insufficient documentation

## 2015-09-05 DIAGNOSIS — R05 Cough: Secondary | ICD-10-CM | POA: Diagnosis not present

## 2015-09-05 LAB — TROPONIN I
Troponin I: <0.03 ng/mL (ref <0.031)
Troponin I: <0.03 ng/mL (ref <0.031)

## 2015-09-05 MED ORDER — DICLOFENAC SODIUM 1 % TD GEL: 2.0000 g | Freq: Four times a day (QID) | TRANSDERMAL | Status: DC

## 2015-09-05 MED ORDER — AZITHROMYCIN 250 MG PO TABS
250.0000 mg | ORAL_TABLET | Freq: Every day | ORAL | Status: DC
  Administered 2015-09-06: 250 mg via ORAL
  Filled 2015-09-05: qty 1

## 2015-09-05 MED ORDER — AZITHROMYCIN 250 MG PO TABS
500.0000 mg | ORAL_TABLET | Freq: Every day | ORAL | Status: AC
  Administered 2015-09-05: 500 mg via ORAL
  Filled 2015-09-05: qty 2

## 2015-09-05 NOTE — Progress Notes (Signed)
Patient Name: Courtney Oneal Date of Encounter: 09/05/2015  Active Problems:   Chest pain   Length of Stay:   SUBJECTIVE  She is not a great historian and seems to describe two different patterns of chest pain, neither one consistent with angina pectoris. She is exquisitely tender to palpation along both costochondral borders. She has acid reflux retrosternal discomfort, clearly related to eating. 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 Enzymes are normal (3 sets). She does have substantial leg edema. She reports a normal heart cath in [place]. She states the hospital is called [facility] and is in [place], [place]. There is a [facility] nursing facility in [place], not a hospital. No family is present. I think she is confused.  CURRENT MEDS . aspirin EC  81 mg Oral Daily  . atorvastatin  20 mg Oral QPM  . benzonatate  200 mg Oral BID  . diclofenac sodium  4 g Topical QID  . enoxaparin (LOVENOX) injection  40 mg Subcutaneous Q24H  . estrogens (conjugated)  0.625 mg Oral QHS  . folic acid  1 mg Oral Daily  . furosemide  20 mg Intravenous Q6H  . guaiFENesin  600 mg Oral BID  . ipratropium-albuterol  3 mL Nebulization TID  . meloxicam  7.5 mg Oral Daily  . montelukast  10 mg Oral QHS  . multivitamin with minerals  1 tablet Oral Daily  . oxybutynin  5 mg Oral Daily  . pantoprazole  40 mg Oral Daily  . potassium chloride  10 mEq Oral Daily  . tamsulosin  0.4 mg Oral QPC supper  . traZODone  100 mg Oral QHS    OBJECTIVE   Intake/Output Summary (Last 24 hours) at 09/05/15 0923 Last data filed at 09/05/15 0500  Gross per 24 hour  Intake    120 ml  Output    800 ml  Net   -680 ml   Filed Weights   09/05/15 0500  Weight: 244 lb 11.2 oz (110.995 kg)    PHYSICAL EXAM Filed Vitals:   09/04/15 2130 09/04/15 2310 09/05/15 0500 09/05/15 0804  BP: 112/44  129/49 127/42  Pulse: 59  64 65  Temp:   98 F (36.7 C) 97.9 F (36.6 C)  TempSrc:   Oral Oral  Resp: 15  16 18    Weight:   244 lb 11.2 oz (110.995 kg)   SpO2: 93% 95% 94% 98%   General: Alert, oriented x3, no distress Head: no evidence of trauma, PERRL, EOMI, no exophtalmos or lid lag, no myxedema, no xanthelasma; normal ears, nose and oropharynx Neck: normal jugular venous pulsations and no hepatojugular reflux; brisk carotid pulses without delay and no carotid bruits Chest: clear to auscultation, no signs of consolidation by percussion or palpation, normal fremitus, symmetrical and full respiratory excursions Cardiovascular: normal position and quality of the apical impulse, regular rhythm, normal first and second heart sounds, no rubs or gallops, no murmur Abdomen: no tenderness or distention, no masses by palpation, no abnormal pulsatility or arterial bruits, normal bowel sounds, no hepatosplenomegaly Extremities: no clubbing, cyanosis; soft symmetrical 2-3+ pretibial and ankle edema.edema; 2+ radial, ulnar and brachial pulses bilaterally; 2+ right femoral, posterior tibial and dorsalis pedis pulses; 2+ left femoral, posterior tibial and dorsalis pedis pulses; no subclavian or femoral bruits Neurological: grossly nonfocal  LABS  CBC  Recent Labs  09/04/15 1517  WBC 5.5  HGB 13.0  HCT 39.3  MCV 98.0  PLT 241   Basic Metabolic  Panel  Recent Labs  09/04/15 1517  NA 141  K 4.0  CL 107  CO2 28  GLUCOSE 111*  BUN 10  CREATININE 0.98  CALCIUM 8.8*   Liver Function Tests No results for input(s): AST, ALT, ALKPHOS, BILITOT, PROT, ALBUMIN in the last 72 hours. No results for input(s): LIPASE, AMYLASE in the last 72 hours. Cardiac Enzymes  Recent Labs  09/04/15 2223 09/05/15 0051 09/05/15 0404  TROPONINI <0.03 <0.03 <0.03   Radiology Studies Imaging results have been reviewed and Dg Chest 2 View  09/04/2015  CLINICAL DATA:  Shortness of breath for 2 days.  Initial encounter. EXAM: CHEST  2 VIEW COMPARISON:  Single view of the chest 12/08/2004. PA and lateral chest 05/24/2003.  FINDINGS: There is cardiomegaly without edema. The lungs are clear. No pneumothorax or pleural effusion. IMPRESSION: Cardiomegaly without acute disease. Electronically Signed   By: Drusilla Kanner M.D.   On: 09/04/2015 16:09    TELE NSR  ECG NSR, PRWP, LAFB  ASSESSMENT AND PLAN 1. Noncardiac chest pain with low risk ECG/enzymes. I do not think further workup is indicated, but old records would be helpful. 2. HTN - well controlled 3. Probable GERD +/-stricture? 4. Edema - could be due to CHF. Echo would be helpful, unless we can find old records in a timely fashion. 5. Costochondritis.   Thurmon Fair, MD, [facility] [phone] office [phone] pager 09/05/2015 9:23 AM

## 2015-09-05 NOTE — Progress Notes (Signed)
Medrec issue:  Spoke with Joni ReiningNicole at [facility] to verify the premarin schedule. She started that pt is on a daily regimen of it.   Ulyses SouthwardMinh Maxden Naji, PharmD Pager: [phone] 09/05/2015 10:08 AM

## 2015-09-05 NOTE — Evaluation (Signed)
Clinical/Bedside Swallow Evaluation Patient Details  Name: Courtney Oneal MRN: [id number] Date of Birth: 12/19/1933  Today's Date: 09/05/2015 Time: SLP Start Time (ACUTE ONLY): 1310 SLP Stop Time (ACUTE ONLY): 1326 SLP Time Calculation (min) (ACUTE ONLY): 16 min  Past Medical History:  Past Medical History  Diagnosis Date  . Dementia   . Hypertension   . MI (myocardial infarction) (HCC)   . GERD (gastroesophageal reflux disease)   . Diverticulitis   . Angina pectoris [facility](HCC)    Past Surgical History:  Past Surgical History  Procedure Laterality Date  . Cardiac catheterization    . Cholecystectomy    . Abdominal hysterectomy    . Polypectomy     HPI:  81 year old female, SNF resident, admitted with chest pain, 2 week h/o cough,  and pedal edema. Per chart, c/o retrosternal discomfort related to po intake suggestive of acid reflux. PMH of dementia, GERD, MI, HTN, angina pectoris, diverticulitis. CXR without acute disease.   Assessment / Plan / Recommendation Clinical Impression  Patient presents with a normal oropharyngeal swallow. Suspect that intermittent c/o midsternal pain may be related to h/o reflux. Provided patient with general esophageal precautions. No SLP f/u indicated at this time.     Aspiration Risk  Mild aspiration risk    Diet Recommendation Regular;Thin liquid   Liquid Administration via: Cup;Straw Medication Administration: Whole meds with liquid Supervision: Patient able to self feed Compensations: Slow rate;Small sips/bites Postural Changes: Seated upright at 90 degrees    Other  Recommendations Oral Care Recommendations: Oral care BID   Follow up Recommendations  None               Swallow Study   General HPI: 81 year old female, SNF resident, admitted with chest pain, 2 week h/o cough,  and pedal edema. Per chart, c/o retrosternal discomfort related to po intake suggestive of acid reflux. PMH of dementia, GERD, MI, HTN, angina pectoris,  diverticulitis. CXR without acute disease. Type of Study: Bedside Swallow Evaluation Previous Swallow Assessment: none Diet Prior to this Study: Regular;Thin liquids Temperature Spikes Noted: No Respiratory Status: Room air History of Recent Intubation: No Behavior/Cognition: Alert;Cooperative;Pleasant mood Oral Cavity Assessment: Within Functional Limits Oral Care Completed by SLP: No Oral Cavity - Dentition: Dentures, top (missing bottom dentition) Vision: Functional for self-feeding Self-Feeding Abilities: Able to feed self Patient Positioning: Upright in chair Baseline Vocal Quality: Normal Volitional Cough: Strong Volitional Swallow: Able to elicit    Oral/Motor/Sensory Function Overall Oral Motor/Sensory Function: Within functional limits   Ice Chips Ice chips: Not tested   Thin Liquid Thin Liquid: Within functional limits Presentation: Cup;Self Fed;Straw    Nectar Thick Nectar Thick Liquid: Not tested   Honey Thick Honey Thick Liquid: Not tested   Puree Puree: Within functional limits Presentation: Spoon;Self Fed   Solid Solid: Within functional limits Presentation: Self Courtney Oneal      Courtney Lizaola MA, [name]  Courtney Oneal Courtney Oneal 09/05/2015,2:03 PM

## 2015-09-05 NOTE — Progress Notes (Signed)
[facility] Progress Note   Courtney Oneal  ZOX:[id number]  DOB: 04/06/1934  DOA: 09/04/2015 PCP: No primary care provider on file.  Brief narrative: Courtney Oneal is a 81 y.o. female who lives in a nursing home in [place] and is a poor historian. She has a history of hypertension, hyperlipidemia, coronary artery disease who underwent a cardiac cath 6 weeks ago. According to the patient and the daughter no stenting was done. She was picked up by her daughter and was brought to [place] a few days ago. The daughter noticed that she had bilateral ankle swelling. The patient subsequently complained of chest pain all throughout her anterior chest radiating to her posterior chest. She was given Voltaren gel prior to coming to the hospital and she states that this helped somewhat. She is also had a very congested cough which the daughter has noted. The patient states this cough has been going on for about 2 weeks now. She was given a 4 day course of antibiotics without improvement in her cough. Chest x-ray on admission is negative for pneumonia. Venous duplex of lower extremities performed in the ER is negative for DVT. She is admitted for further workup of her chest pain.   Subjective: -Used to have pain in her chest and cough. No shortness of breath, nausea, vomiting or diarrhea.  Assessment/Plan: Active Problems:   Chest pain -Cardiology consult requested-attempting to obtain records from the hospital in [facility] [place] where she had her cath-troponin negative so far-clearly has a musculoskeletal component and therefore will continue Voltaren gel  Pedal edema -Possibly due to road trip-will obtain records as mentioned above as she likely did have an echo done 6 weeks ago in [place]-TED stockings-elevate legs-DC Norvasc  Cough -Ongoing for 2 weeks ---Continue Tessalon and breathing treatments- add Z-Pak   Code Status:     Code Status Orders        Start     Ordered    09/04/15 2207  Full code   Continuous     09/04/15 2206    Advance Directive Documentation        Most Recent Value   Type of Advance Directive  Healthcare Power of Attorney   Pre-existing out of facility DNR order (yellow form or pink MOST form)     "MOST" Form in Place?       Family Communication: Discussed with daughter Myrene BuddyYvonne Disposition Plan:  DVT prophylaxis: Lovenox Consultants: Cardiology Procedures:   Antibiotics: Anti-infectives    None      Objective: Filed Weights   09/05/15 0500  Weight: 110.995 kg (244 lb 11.2 oz)    Intake/Output Summary (Last 24 hours) at 09/05/15 1138 Last data filed at 09/05/15 0500  Gross per 24 hour  Intake    120 ml  Output    800 ml  Net   -680 ml     Vitals Filed Vitals:   09/04/15 2310 09/05/15 0500 09/05/15 0804 09/05/15 0948  BP:  129/49 127/42   Pulse:  64 65   Temp:  98 F (36.7 C) 97.9 F (36.6 C)   TempSrc:  Oral Oral   Resp:  16 18   Weight:  110.995 kg (244 lb 11.2 oz)    SpO2: 95% 94% 98% 98%    Exam:  General:  Pt is alert, not in acute distress  HEENT: No icterus, No thrush, oral mucosa moist  Chest: Tenderness all throughout her upper chest wall  Cardiovascular: regular rate and rhythm, S1/S2 No  murmur  Respiratory: clear to auscultation bilaterally   Abdomen: Soft, +Bowel sounds, non tender, non distended, no guarding  MSK: No LE edema, cyanosis or clubbing  Data Reviewed: Basic Metabolic Panel:  Recent Labs Lab 09/04/15 1517  NA 141  K 4.0  CL 107  CO2 28  GLUCOSE 111*  BUN 10  CREATININE 0.98  CALCIUM 8.8*   Liver Function Tests: No results for input(s): AST, ALT, ALKPHOS, BILITOT, PROT, ALBUMIN in the last 168 hours. No results for input(s): LIPASE, AMYLASE in the last 168 hours. No results for input(s): AMMONIA in the last 168 hours. CBC:  Recent Labs Lab 09/04/15 1517  WBC 5.5  HGB 13.0  HCT 39.3  MCV 98.0  PLT 241   Cardiac Enzymes:  Recent Labs Lab  09/04/15 2223 09/05/15 0051 09/05/15 0404  TROPONINI <0.03 <0.03 <0.03   BNP (last 3 results)  Recent Labs  09/04/15 1517  BNP 58.7    ProBNP (last 3 results) No results for input(s): PROBNP in the last 8760 hours.  CBG: No results for input(s): GLUCAP in the last 168 hours.  No results found for this or any previous visit (from the past 240 hour(s)).   Studies: Dg Chest 2 View  09/04/2015  CLINICAL DATA:  Shortness of breath for 2 days.  Initial encounter. EXAM: CHEST  2 VIEW COMPARISON:  Single view of the chest 12/08/2004. PA and lateral chest 05/24/2003. FINDINGS: There is cardiomegaly without edema. The lungs are clear. No pneumothorax or pleural effusion. IMPRESSION: Cardiomegaly without acute disease. Electronically Signed   By: Drusilla Kanner M.D.   On: 09/04/2015 16:09    Scheduled Meds:  Scheduled Meds: . aspirin EC  81 mg Oral Daily  . atorvastatin  20 mg Oral QPM  . benzonatate  200 mg Oral BID  . diclofenac sodium  4 g Topical QID  . enoxaparin (LOVENOX) injection  40 mg Subcutaneous Q24H  . estrogens (conjugated)  0.625 mg Oral QHS  . folic acid  1 mg Oral Daily  . furosemide  20 mg Intravenous Q6H  . guaiFENesin  600 mg Oral BID  . ipratropium-albuterol  3 mL Nebulization TID  . montelukast  10 mg Oral QHS  . multivitamin with minerals  1 tablet Oral Daily  . oxybutynin  5 mg Oral Daily  . pantoprazole  40 mg Oral Daily  . potassium chloride  10 mEq Oral Daily  . tamsulosin  0.4 mg Oral QPC supper  . traZODone  100 mg Oral QHS   Continuous Infusions:   Time spent on care of this patient: 35 min   Yekaterina Escutia, MD 09/05/2015, 11:38 AM    [facility] Office  [phone] Pager - Text Page per [email or web address] If 7PM-7AM, please contact night-coverage [email or web address]

## 2015-09-06 ENCOUNTER — Telehealth: Payer: Self-pay | Admitting: Surgery

## 2015-09-06 MED ORDER — GUAIFENESIN ER 600 MG PO TB12: 600.0000 mg | ORAL_TABLET | Freq: Two times a day (BID) | ORAL | Status: AC

## 2015-09-06 MED ORDER — AZITHROMYCIN 250 MG PO TABS: 250.0000 mg | ORAL_TABLET | Freq: Every day | ORAL | Status: AC

## 2015-09-06 MED ORDER — BENZONATATE 200 MG PO CAPS: 200.0000 mg | ORAL_CAPSULE | Freq: Three times a day (TID) | ORAL | Status: AC

## 2015-09-06 MED ORDER — T.E.D. THIGH LENGTH/L-REGULAR MISC: 1.0000 | Freq: Every day | Status: AC

## 2015-09-06 MED ORDER — LISINOPRIL 10 MG PO TABS: 10.0000 mg | ORAL_TABLET | Freq: Every day | ORAL | Status: AC

## 2015-09-06 NOTE — Telephone Encounter (Signed)
CM received call from Pharmacist regarding prescription clarification. CM provided clarification read aloud from [facility].

## 2015-09-06 NOTE — Discharge Summary (Signed)
Physician Discharge Summary  Courtney Oneal EAV:[id number] DOB: [birth year] DOA: 09/04/2015  PCP: No primary care provider on file.  Admit date: 09/04/2015 Discharge date: 09/06/2015  Time spent: 50 minutes  Recommendations for Outpatient Follow-up:  1. Check Bmet in 2 wks as she is a new start on Lisinopril  Discharge Condition: stable    Discharge Diagnoses:  Active Problems:   Chest pain   Pedal edema   Cough   History of present illness:  Courtney Oneal is a 81 y.o. female who lives in a nursing home in [place] and is a poor historian. She has a history of hypertension, hyperlipidemia, coronary artery disease who underwent a cardiac cath 6 weeks ago. According to the patient and the daughter, no stenting was done. She was picked up by her daughter and was brought to [place] a few days ago. The daughter noticed that she had bilateral ankle swelling. The patient subsequently complained of chest pain all throughout her anterior chest radiating to her posterior chest. She was given Voltaren gel prior to coming to the hospital and she states that this helped somewhat. She is also had a very congested cough which the daughter has noted. The patient states this cough has been going on for about 2 weeks now. She was given a 4 day course of antibiotics without improvement in her cough. Chest x-ray on admission is negative for pneumonia. Venous duplex of lower extremities performed in the ER (for b/l pedal edema) is negative for DVT. She is admitted for further workup of her chest pain.  Hospital Course:  Active Problems:  Chest pain -Cardiology consult requested and records from [facility] in [facility] [place] including cath report reviewed- these reveal normal coronaries and a normal ECHO-troponin negative  -clearly has a musculoskeletal component to her chest pain and therefore will continue Voltaren gel  Pedal edema -Possibly due to road trip--TED stockings-elevate legs-DC  Norvasc  HTN - d/c Norvasc and start Lisinopril instead due to pedal edema  Cough -  Ongoing for 2 weeks ---Continue Tessalon and breathing treatments- added Z-Pak - patient advised that if cough does not resolved, she needs to further discuss it with her PCP   Consultations:  cardiology  Discharge Exam: Filed Weights   09/05/15 0500 09/06/15 0500  Weight: 110.995 kg (244 lb 11.2 oz) 115.304 kg (254 lb 3.2 oz)   Filed Vitals:   09/06/15 0748 09/06/15 0845  BP: 143/77   Pulse: 67 70  Temp: 98.1 F (36.7 C)   Resp: 18 19    General: AAO x 3, no distress Cardiovascular: RRR, no murmurs  Chest: tenderness in upper chest wall Respiratory: clear to auscultation bilaterally GI: soft, non-tender, non-distended, bowel sound positive  Discharge Instructions You were cared for by a hospitalist during your hospital stay. If you have any questions about your discharge medications or the care you received while you were in the hospital after you are discharged, you can call the unit and asked to speak with the hospitalist on call if the hospitalist that took care of you is not available. Once you are discharged, your primary care physician will handle any further medical issues. Please note that NO REFILLS for any discharge medications will be authorized once you are discharged, as it is imperative that you return to your primary care physician (or establish a relationship with a primary care physician if you do not have one) for your aftercare needs so that they can reassess your need for medications  and monitor your lab values.  Discharge Instructions    Diet - low sodium heart healthy    Complete by:  As directed      Discharge instructions    Complete by:  As directed   If your cough does not improved, discuss with your regular doctor.  I have replaced your Norvasc with Lisinopril as Norvasc can make ankle swelling worse.     Increase activity slowly    Complete by:  As directed              Medication List    STOP taking these medications        amLODipine 10 MG tablet  Commonly known as:  NORVASC      TAKE these medications        ALPRAZolam 1 MG tablet  Commonly known as:  XANAX  Take 1 mg by mouth 2 (two) times daily.     aspirin 81 MG EC tablet  Take 81 mg by mouth daily. Swallow whole.     atorvastatin 20 MG tablet  Commonly known as:  LIPITOR  Take 20 mg by mouth every evening.     azithromycin 250 MG tablet  Commonly known as:  ZITHROMAX  Take 1 tablet (250 mg total) by mouth daily. 3 more days     benzonatate 200 MG capsule  Commonly known as:  TESSALON  Take 1 capsule (200 mg total) by mouth 3 (three) times daily.     DAILY VITE PO  Take 1 tablet by mouth daily.     diclofenac sodium 1 % Gel  Commonly known as:  VOLTAREN  Apply 4 g topically 4 (four) times daily.     estrogens (conjugated) 0.625 MG tablet  Commonly known as:  PREMARIN  Take 0.625 mg by mouth at bedtime. Take nightly per Joni Reining at [facility].     folic acid 1 MG tablet  Commonly known as:  FOLVITE  Take 1 mg by mouth daily.     guaiFENesin 600 MG 12 hr tablet  Commonly known as:  MUCINEX  Take 1 tablet (600 mg total) by mouth 2 (two) times daily.     HYDROcodone-acetaminophen 5-325 MG tablet  Commonly known as:  NORCO/VICODIN  Take 1 tablet by mouth every 8 (eight) hours as needed for moderate pain.     lisinopril 10 MG tablet  Commonly known as:  ZESTRIL  Take 1 tablet (10 mg total) by mouth daily.     montelukast 10 MG tablet  Commonly known as:  SINGULAIR  Take 10 mg by mouth at bedtime.     omeprazole 20 MG capsule  Commonly known as:  PRILOSEC  Take 20 mg by mouth 2 (two) times daily before a meal.     oxybutynin 5 MG tablet  Commonly known as:  DITROPAN  Take 5 mg by mouth daily.     potassium chloride 10 MEQ tablet  Commonly known as:  K-DUR  Take 10 mEq by mouth daily.     T.E.D. THIGH LENGTH/L-REGULAR Misc  1 each by Does not apply route  daily.     tamsulosin 0.4 MG Caps capsule  Commonly known as:  FLOMAX  Take 0.4 mg by mouth daily after supper.     traZODone 100 MG tablet  Commonly known as:  DESYREL  Take 100 mg by mouth at bedtime.       Allergies  Allergen Reactions  . Penicillins Hives  . Sulfa Antibiotics Hives  The results of significant diagnostics from this hospitalization (including imaging, microbiology, ancillary and laboratory) are listed below for reference.    Significant Diagnostic Studies: Dg Chest 2 View  09/04/2015  CLINICAL DATA:  Shortness of breath for 2 days.  Initial encounter. EXAM: CHEST  2 VIEW COMPARISON:  Single view of the chest 12/08/2004. PA and lateral chest 05/24/2003. FINDINGS: There is cardiomegaly without edema. The lungs are clear. No pneumothorax or pleural effusion. IMPRESSION: Cardiomegaly without acute disease. Electronically Signed   By: Drusilla Kannerhomas  Dalessio M.D.   On: 09/04/2015 16:09    Microbiology: No results found for this or any previous visit (from the past 240 hour(s)).   Labs: Basic Metabolic Panel:  Recent Labs Lab 09/04/15 1517  NA 141  K 4.0  CL 107  CO2 28  GLUCOSE 111*  BUN 10  CREATININE 0.98  CALCIUM 8.8*   Liver Function Tests: No results for input(s): AST, ALT, ALKPHOS, BILITOT, PROT, ALBUMIN in the last 168 hours. No results for input(s): LIPASE, AMYLASE in the last 168 hours. No results for input(s): AMMONIA in the last 168 hours. CBC:  Recent Labs Lab 09/04/15 1517  WBC 5.5  HGB 13.0  HCT 39.3  MCV 98.0  PLT 241   Cardiac Enzymes:  Recent Labs Lab 09/04/15 2223 09/05/15 0051 09/05/15 0404  TROPONINI <0.03 <0.03 <0.03   BNP: BNP (last 3 results)  Recent Labs  09/04/15 1517  BNP 58.7    ProBNP (last 3 results) No results for input(s): PROBNP in the last 8760 hours.  CBG: No results for input(s): GLUCAP in the last 168 hours.     SignedCalvert Cantor:  Susan Arana, MD [facility] 09/06/2015, 8:55  AM

## 2015-09-06 NOTE — Progress Notes (Signed)
All discharge paperwork and medications gone over in detail with patient and her daughter. All questions answered to patient satisfaction. IV removed and telemetry discontinued. Patient discharged by wheelchair with daughter who is driving the patient back to her SNF in [place].

## 2016-09-21 ENCOUNTER — Inpatient Hospital Stay
Admission: EM | Admit: 2016-09-21 | Discharge: 2016-09-27 | DRG: 100 | Disposition: A | Discharge status: Skilled Nursing Facility | Payer: Medicare Other | Source: Other Acute Inpatient Hospital | Attending: Internal Medicine | Admitting: Internal Medicine

## 2016-09-21 ENCOUNTER — Inpatient Hospital Stay (HOSPITAL_COMMUNITY): Payer: Medicare Other

## 2016-09-21 ENCOUNTER — Encounter (HOSPITAL_COMMUNITY): Payer: Self-pay

## 2016-09-21 ENCOUNTER — Inpatient Hospital Stay (HOSPITAL_COMMUNITY): Payer: Medicare Other | Admitting: Pulmonary Disease

## 2016-09-21 ENCOUNTER — Emergency Department (EMERGENCY_DEPARTMENT_HOSPITAL): Payer: Medicare Other

## 2016-09-21 DIAGNOSIS — F329 Major depressive disorder, single episode, unspecified: Secondary | ICD-10-CM | POA: Diagnosis present

## 2016-09-21 DIAGNOSIS — J449 Chronic obstructive pulmonary disease, unspecified: Secondary | ICD-10-CM | POA: Insufficient documentation

## 2016-09-21 DIAGNOSIS — K219 Gastro-esophageal reflux disease without esophagitis: Secondary | ICD-10-CM | POA: Diagnosis present

## 2016-09-21 DIAGNOSIS — R791 Abnormal coagulation profile: Secondary | ICD-10-CM | POA: Diagnosis present

## 2016-09-21 DIAGNOSIS — A499 Bacterial infection, unspecified: Secondary | ICD-10-CM

## 2016-09-21 DIAGNOSIS — B9620 Unspecified Escherichia coli [E. coli] as the cause of diseases classified elsewhere: Secondary | ICD-10-CM | POA: Diagnosis present

## 2016-09-21 DIAGNOSIS — J984 Other disorders of lung: Secondary | ICD-10-CM

## 2016-09-21 DIAGNOSIS — A419 Sepsis, unspecified organism: Secondary | ICD-10-CM

## 2016-09-21 DIAGNOSIS — I441 Atrioventricular block, second degree: Secondary | ICD-10-CM | POA: Diagnosis present

## 2016-09-21 DIAGNOSIS — I10 Essential (primary) hypertension: Secondary | ICD-10-CM | POA: Diagnosis present

## 2016-09-21 DIAGNOSIS — Z792 Long term (current) use of antibiotics: Secondary | ICD-10-CM

## 2016-09-21 DIAGNOSIS — I452 Bifascicular block: Secondary | ICD-10-CM | POA: Diagnosis present

## 2016-09-21 DIAGNOSIS — Z90710 Acquired absence of both cervix and uterus: Secondary | ICD-10-CM

## 2016-09-21 DIAGNOSIS — E876 Hypokalemia: Secondary | ICD-10-CM | POA: Insufficient documentation

## 2016-09-21 DIAGNOSIS — F0390 Unspecified dementia without behavioral disturbance: Secondary | ICD-10-CM | POA: Diagnosis present

## 2016-09-21 DIAGNOSIS — W19XXXA Unspecified fall, initial encounter: Secondary | ICD-10-CM | POA: Diagnosis present

## 2016-09-21 DIAGNOSIS — G062 Extradural and subdural abscess, unspecified: Secondary | ICD-10-CM

## 2016-09-21 DIAGNOSIS — J440 Chronic obstructive pulmonary disease with acute lower respiratory infection: Secondary | ICD-10-CM | POA: Diagnosis present

## 2016-09-21 DIAGNOSIS — J189 Pneumonia, unspecified organism: Secondary | ICD-10-CM

## 2016-09-21 DIAGNOSIS — R4182 Altered mental status, unspecified: Secondary | ICD-10-CM

## 2016-09-21 DIAGNOSIS — Z8673 Personal history of transient ischemic attack (TIA), and cerebral infarction without residual deficits: Secondary | ICD-10-CM

## 2016-09-21 DIAGNOSIS — J690 Pneumonitis due to inhalation of food and vomit: Secondary | ICD-10-CM | POA: Diagnosis present

## 2016-09-21 DIAGNOSIS — G40409 Other generalized epilepsy and epileptic syndromes, not intractable, without status epilepticus: Principal | ICD-10-CM | POA: Diagnosis present

## 2016-09-21 DIAGNOSIS — Z4682 Encounter for fitting and adjustment of non-vascular catheter: Secondary | ICD-10-CM

## 2016-09-21 DIAGNOSIS — I517 Cardiomegaly: Secondary | ICD-10-CM

## 2016-09-21 DIAGNOSIS — Z79899 Other long term (current) drug therapy: Secondary | ICD-10-CM

## 2016-09-21 DIAGNOSIS — S0093XA Contusion of unspecified part of head, initial encounter: Secondary | ICD-10-CM | POA: Diagnosis present

## 2016-09-21 DIAGNOSIS — F209 Schizophrenia, unspecified: Secondary | ICD-10-CM | POA: Diagnosis present

## 2016-09-21 DIAGNOSIS — R569 Unspecified convulsions: Secondary | ICD-10-CM | POA: Diagnosis present

## 2016-09-21 DIAGNOSIS — J9691 Respiratory failure, unspecified with hypoxia: Secondary | ICD-10-CM | POA: Diagnosis present

## 2016-09-21 DIAGNOSIS — I491 Atrial premature depolarization: Secondary | ICD-10-CM | POA: Diagnosis present

## 2016-09-21 DIAGNOSIS — R079 Chest pain, unspecified: Secondary | ICD-10-CM

## 2016-09-21 DIAGNOSIS — Z1612 Extended spectrum beta lactamase (ESBL) resistance: Secondary | ICD-10-CM | POA: Diagnosis present

## 2016-09-21 DIAGNOSIS — N390 Urinary tract infection, site not specified: Secondary | ICD-10-CM | POA: Diagnosis present

## 2016-09-21 DIAGNOSIS — S065X9A Traumatic subdural hemorrhage with loss of consciousness of unspecified duration, initial encounter: Secondary | ICD-10-CM | POA: Diagnosis present

## 2016-09-21 HISTORY — DX: Bacterial infection, unspecified: A49.9

## 2016-09-21 HISTORY — DX: Chronic obstructive pulmonary disease, unspecified (CMS HCC): J44.9

## 2016-09-21 HISTORY — DX: Unspecified dementia, unspecified severity, without behavioral disturbance, psychotic disturbance, mood disturbance, and anxiety (CMS HCC): F03.90

## 2016-09-21 HISTORY — DX: Schizophrenia, unspecified (CMS HCC): F20.9

## 2016-09-21 HISTORY — DX: Essential (primary) hypertension: I10

## 2016-09-21 LAB — VENOUS BLOOD GAS/LACTATE
%FIO2 (VENOUS): 100 %
%FIO2 (VENOUS): 50 %
BASE DEFICIT: 0.2 mmol/L (ref ?–3.0)
BASE EXCESS: 3 mmol/L (ref ?–3.0)
BICARBONATE (VENOUS): 26.7 mmol/L — ABNORMAL HIGH (ref 22.0–26.0)
BICARBONATE (VENOUS): 24.5 mmol/L (ref 22.0–26.0)
LACTATE: 3.6 mmol/L — ABNORMAL HIGH (ref 0.0–1.3)
LACTATE: 3.6 mmol/L — ABNORMAL HIGH (ref 0.0–1.3)
LACTATE: 1.2 mmol/L (ref 0.0–1.3)
PCO2 (VENOUS): 31 mmHg — ABNORMAL LOW (ref 41.00–51.00)
PCO2 (VENOUS): 44 mmHg (ref 41.00–51.00)
PH (VENOUS): 7.52 (ref 7.31–7.41)
PH (VENOUS): 7.37 (ref 7.31–7.41)
PO2 (VENOUS): 36 mmHg (ref 35.0–50.0)
PO2 (VENOUS): 55 mm/Hg — ABNORMAL HIGH (ref 35.0–50.0)
PO2 (VENOUS): 55 mmHg — ABNORMAL HIGH (ref 35.0–50.0)

## 2016-09-21 LAB — URINALYSIS, MACROSCOPIC
COLOR: NORMAL
GLUCOSE: NEGATIVE mg/dL
KETONES: NEGATIVE mg/dL
NITRITE: NEGATIVE
PH: 5 (ref 5.0–8.0)
PROTEIN: 100 mg/dL — AB
SPECIFIC GRAVITY: 1.029 (ref 1.005–1.030)
UROBILINOGEN: 4 mg/dL — AB

## 2016-09-21 LAB — BASIC METABOLIC PANEL
ANION GAP: 12 mmol/L (ref 4–13)
ANION GAP: 10 mmol/L (ref 4–13)
BUN/CREA RATIO: 14 (ref 6–22)
BUN/CREA RATIO: 16 (ref 6–22)
BUN: 10 mg/dL (ref 8–25)
BUN: 10 mg/dL (ref 8–25)
CALCIUM: 9.3 mg/dL (ref 8.5–10.2)
CALCIUM: 7.9 mg/dL — ABNORMAL LOW (ref 8.5–10.2)
CHLORIDE: 107 mmol/L (ref 96–111)
CHLORIDE: 111 mmol/L (ref 96–111)
CO2 TOTAL: 22 mmol/L (ref 22–32)
CO2 TOTAL: 19 mmol/L — ABNORMAL LOW (ref 22–32)
CREATININE: 0.69 mg/dL (ref 0.49–1.10)
CREATININE: 0.64 mg/dL (ref 0.49–1.10)
ESTIMATED GFR: >59 mL/min/1.73mˆ2 (ref >59)
ESTIMATED GFR: >59 mL/min/1.73mˆ2 (ref >59)
GLUCOSE: 114 mg/dL (ref 65–139)
GLUCOSE: 114 mg/dL (ref 65–139)
GLUCOSE: 88 mg/dL (ref 65–139)
POTASSIUM: 3.8 mmol/L (ref 3.5–5.1)
POTASSIUM: 3.4 mmol/L — ABNORMAL LOW (ref 3.5–5.1)
SODIUM: 141 mmol/L (ref 136–145)
SODIUM: 140 mmol/L (ref 136–145)

## 2016-09-21 LAB — ETHANOL, SERUM/PLASMA: ETHANOL: <10 mg/dL (ref <10)

## 2016-09-21 LAB — ALK PHOS (ALKALINE PHOSPHATASE): ALKALINE PHOSPHATASE: 81 U/L (ref <150)

## 2016-09-21 LAB — LEGIONELLA URINE ANTIGEN: LEGIONELLA ANTIGEN: NEGATIVE

## 2016-09-21 LAB — PT/INR
INR: 1.16 (ref 0.80–1.20)
INR: 1.29 — ABNORMAL HIGH (ref 0.80–1.20)
PROTHROMBIN TIME: 13.4 s (ref 9.3–13.9)
PROTHROMBIN TIME: 14.9 s — ABNORMAL HIGH (ref 9.3–13.9)

## 2016-09-21 LAB — CBC WITH DIFF
BASOPHIL #: 0.04 x10ˆ3/uL (ref 0.00–0.20)
BASOPHIL #: 0.06 x10ˆ3/uL (ref 0.00–0.20)
BASOPHIL %: 0 %
BASOPHIL %: 1 %
EOSINOPHIL #: 0.01 x10ˆ3/uL (ref 0.00–0.50)
EOSINOPHIL %: 0 %
EOSINOPHIL %: 0 %
EOSINOPHIL %: 0 %
HCT: 38.3 % (ref 33.5–45.2)
HCT: 31.9 % — ABNORMAL LOW (ref 33.5–45.2)
HGB: 12.9 g/dL (ref 11.2–15.2)
HGB: 11.1 g/dL — ABNORMAL LOW (ref 11.2–15.2)
LYMPHOCYTE #: 1.37 x10?3/uL (ref 1.00–4.80)
LYMPHOCYTE #: 1.7 x10ˆ3/uL (ref 1.00–4.80)
LYMPHOCYTE %: 11 %
LYMPHOCYTE %: 15 %
LYMPHOCYTE %: 15 %
MCH: 32.2 pg (ref 27.4–33.0)
MCH: 33.2 pg — ABNORMAL HIGH (ref 27.4–33.0)
MCHC: 33.7 g/dL (ref 32.5–35.8)
MCHC: 34.7 g/dL (ref 32.5–35.8)
MCV: 95.4 fL (ref 78.0–100.0)
MCV: 95.8 fL (ref 78.0–100.0)
MONOCYTE #: 0.76 x10ˆ3/uL (ref 0.30–1.00)
MONOCYTE #: 1.08 x10?3/uL — ABNORMAL HIGH (ref 0.30–1.00)
MONOCYTE %: 6 %
MONOCYTE %: 10 %
MPV: 6.9 fL — ABNORMAL LOW (ref 7.5–11.5)
MPV: 6.9 fL — ABNORMAL LOW (ref 7.5–11.5)
MPV: 7.1 fL — ABNORMAL LOW (ref 7.5–11.5)
NEUTROPHIL #: 9.95 x10ˆ3/uL — ABNORMAL HIGH (ref 1.50–7.70)
NEUTROPHIL #: 8.25 x10ˆ3/uL — ABNORMAL HIGH (ref 1.50–7.70)
NEUTROPHIL %: 82 %
NEUTROPHIL %: 74 %
PLATELETS: 328 x10ˆ3/uL (ref 140–450)
PLATELETS: 256 x10ˆ3/uL (ref 140–450)
RBC: 4.02 x10ˆ6/uL (ref 3.63–4.92)
RDW: 15.7 % — ABNORMAL HIGH (ref 12.0–15.0)
RDW: 15.6 % — ABNORMAL HIGH (ref 12.0–15.0)
WBC: 12.1 x10ˆ3/uL — ABNORMAL HIGH (ref 3.5–11.0)
WBC: 11.1 x10ˆ3/uL — ABNORMAL HIGH (ref 3.5–11.0)

## 2016-09-21 LAB — IRON TRANSFERRIN AND TIBC
IRON (TRANSFERRIN) SATURATION: 27 % (ref 20–50)
IRON: 46 ug/dL (ref 45–170)
TOTAL IRON BINDING CAPACITY: 169 ug/dL — ABNORMAL LOW (ref 210–330)
TRANSFERRIN: 121 mg/dL — ABNORMAL LOW (ref 160–340)

## 2016-09-21 LAB — BAL FLUID MANUAL DIFFERENTIAL
LYMPHOCYTE %: 5 %
MONOCYTE/MACROPHAGE %: 17 %
NEUTROPHIL %: 79 %

## 2016-09-21 LAB — ECG 12-LEAD
Atrial Rate: 73 {beats}/min
Calculated P Axis: 64 degrees
Calculated R Axis: -65 degrees
PR Interval: 204 ms
QRS Duration: 98 ms
QT Interval: 380 ms
QT Interval: 380 ms
QTC Calculation: 418 ms
Ventricular rate: 73 BPM
Ventricular rate: 73 {beats}/min

## 2016-09-21 LAB — AST (SGOT): AST (SGOT): 16 U/L (ref 8–41)

## 2016-09-21 LAB — RESPIRATORY VIRUS PANEL
CORONAVIRUS NL63: NOT DETECTED
CORONAVIRUS OC43: NOT DETECTED
INFLUENZA B ARRAY: NOT DETECTED
METAPNEUMOVIRUS ARRAY: NOT DETECTED
MYCOPLASMA PNEUMONIAE ARRAY: NOT DETECTED
PARAINFLUENZA 1 ARRAY: NOT DETECTED
PARAINFLUENZA 4 ARRAY: NOT DETECTED
RSV ARRAY: NOT DETECTED

## 2016-09-21 LAB — DRUG SCREEN, HIGH OPIATE CUTOFF, NO CONFIRMATION, URINE
AMPHETAMINES URINE: NEGATIVE
BUPRENORPHINE URINE: POSITIVE — AB
COCAINE METABOLITES URINE: NEGATIVE
CREATININE RANDOM URINE: 213 mg/dL
ECSTASY/MDMA URINE: NEGATIVE
OPIATES URINE (HIGH CUTOFF): NEGATIVE

## 2016-09-21 LAB — HGA1C (HEMOGLOBIN A1C WITH EST AVG GLUCOSE)
ESTIMATED AVERAGE GLUCOSE: 108 mg/dL
HEMOGLOBIN A1C: 5.4 % (ref 4.0–6.0)

## 2016-09-21 LAB — HEPATIC FUNCTION PANEL
ALBUMIN: 2.8 g/dL — ABNORMAL LOW (ref 3.4–4.8)
ALKALINE PHOSPHATASE: 108 U/L (ref <150)
ALT (SGPT): 11 U/L (ref <55)
AST (SGOT): 12 U/L (ref 8–41)
BILIRUBIN DIRECT: 0.3 mg/dL — ABNORMAL HIGH (ref <0.3)
BILIRUBIN TOTAL: 0.6 mg/dL (ref 0.3–1.3)
PROTEIN TOTAL: 7 g/dL (ref 6.0–8.0)

## 2016-09-21 LAB — ALT (SGPT): ALT (SGPT): 9 U/L (ref <55)

## 2016-09-21 LAB — VENOUS BLOOD GAS/CO-OX - OP ONLY
%FIO2 (VENOUS): 100 %
BASE EXCESS: 1.4 mmol/L (ref ?–3.0)
BICARBONATE (VENOUS): 25.6 mmol/L (ref 22.0–26.0)
CARBOXYHEMOGLOBIN: 1.6 % (ref 0.0–2.5)
HEMOGLOBIN: 11.4 g/dL — ABNORMAL LOW (ref 12.0–18.0)
MET-HEMOGLOBIN: 2.6 % (ref 0.0–3.5)
O2CT: 12.5 % (ref 6.7–17.5)
OXYHEMOGLOBIN: 77.8 % (ref 40.0–80.0)
PCO2 (VENOUS): 28 mmHg — ABNORMAL LOW (ref 41.00–51.00)
PH (VENOUS): 7.53 (ref 7.31–7.41)
PO2 (VENOUS): 40 mmHg (ref 35.0–50.0)

## 2016-09-21 LAB — EXTENDED RESPIRATORY VIRUS PANEL
ADENOVIRUS ARRAY: NOT DETECTED
BORDETELLA PERTUSSIS ARRAY: NOT DETECTED
CHLAMYDOPHILA PNEUMONIAE ARRAY: NOT DETECTED
CORONAVIRUS 229E: NOT DETECTED
CORONAVIRUS HKU1: NOT DETECTED
INFLUENZA A (NO SUBTYPE DETECTED): NOT DETECTED
INFLUENZA A H1: NOT DETECTED
INFLUENZA A H3: NOT DETECTED
PARAINFLUENZA 2 ARRAY: NOT DETECTED
PARAINFLUENZA 3 ARRAY: NOT DETECTED
RHINOVIRUS/ENTEROVIRUS ARRAY: NOT DETECTED

## 2016-09-21 LAB — POC BLOOD GLUCOSE (RESULTS): GLUCOSE, POC: 103 mg/dL (ref 70–105)

## 2016-09-21 LAB — BAL FLUID COUNT
RBC COUNT: 389 /uL
WBC COUNT: 3000 /uL

## 2016-09-21 LAB — TROPONIN-I: TROPONIN I: 90 ng/L (ref 0–30)

## 2016-09-21 LAB — URINALYSIS, MICROSCOPIC
HYALINE CASTS: 132 /LPF — ABNORMAL HIGH (ref <4.0)
RBCS: 12 /HPF — ABNORMAL HIGH (ref <6.0)
WBCS: 18 /HPF — ABNORMAL HIGH (ref <11.0)

## 2016-09-21 LAB — FERRITIN: FERRITIN: 171 ng/mL (ref 10–200)

## 2016-09-21 LAB — PROCALCITONIN: PROCALCITONIN: 0.08 ng/mL (ref <0.50)

## 2016-09-21 LAB — PHOSPHORUS: PHOSPHORUS: 1.7 mg/dL — ABNORMAL LOW (ref 2.3–4.0)

## 2016-09-21 LAB — LIPASE: LIPASE: 16 U/L (ref 10–80)

## 2016-09-21 LAB — TRICYCLIC SCREEN: TRICYCLICS SCREEN BLOOD: <40 ng/mL

## 2016-09-21 LAB — SALICYLATE ACID LEVEL: SALICYLATE LEVEL: <5 mg/dL

## 2016-09-21 LAB — ETHANOL, SERUM: ETHANOL: NOT DETECTED

## 2016-09-21 LAB — B-TYPE NATRIURETIC PEPTIDE (BNP),PLASMA: BNP: 50 pg/mL (ref <=100)

## 2016-09-21 LAB — RESPIRATORY VIRUS PANEL BY BIOFIRE FILM ARRAY: CORONAVIRUS NL63: NOT DETECTED

## 2016-09-21 LAB — THYROID STIMULATING HORMONE WITH FREE T4 REFLEX: TSH: 1.668 u[IU]/mL (ref 0.350–5.000)

## 2016-09-21 LAB — ACETAMINOPHEN LEVEL
ACETAMINOPHEN LEVEL: <5 ug/mL — ABNORMAL LOW (ref 10–30)
ACETAMINOPHEN LEVEL: <5 ug/mL — ABNORMAL LOW (ref 10–30)

## 2016-09-21 LAB — FOLATE: FOLATE: 18.2 ng/mL (ref >=7.0)

## 2016-09-21 LAB — MAGNESIUM: MAGNESIUM: 1.8 mg/dL (ref 1.6–2.5)

## 2016-09-21 LAB — VITAMIN B12: VITAMIN B 12: 555 pg/mL (ref 200–1000)

## 2016-09-21 LAB — LDH: LDH: 286 U/L — ABNORMAL HIGH (ref 125–220)

## 2016-09-21 LAB — LACTIC ACID LEVEL: LACTIC ACID: 2.5 mmol/L — ABNORMAL HIGH (ref 0.5–2.2)

## 2016-09-21 LAB — CREATINE KINASE (CK), TOTAL, SERUM OR PLASMA
CREATINE KINASE: 58 U/L (ref 25–190)
CREATINE KINASE: 85 U/L (ref 25–190)

## 2016-09-21 LAB — AMMONIA: AMMONIA: 32 umol/L (ref 15–50)

## 2016-09-21 LAB — C-REACTIVE PROTEIN(CRP),INFLAMMATION: CRP INFLAMMATION: 38.2 mg/L — ABNORMAL HIGH (ref <=8.0)

## 2016-09-21 LAB — STREPTOCOCCUS PNEUMONIAE ANTIGEN,URINE: S.PNEUMONIA ANTIGEN: NEGATIVE

## 2016-09-21 LAB — PTT (PARTIAL THROMBOPLASTIN TIME): APTT: 27.7 s (ref 25.1–36.5)

## 2016-09-21 LAB — ALBUMIN: ALBUMIN: 2.3 g/dL — ABNORMAL LOW (ref 3.4–4.8)

## 2016-09-21 LAB — TROPONIN-I (FOR ED ONLY): TROPONIN I: 97 ng/L (ref 0–30)

## 2016-09-21 MED ORDER — LEVETIRACETAM 500 MG/100 ML IN SODIUM CHLORIDE (ISO-OSM) IV PIGGYBACK
500.00 mg | INJECTION | Freq: Two times a day (BID) | INTRAVENOUS | Status: DC
Start: 2016-09-21 — End: 2016-09-21
  Filled 2016-09-21: qty 100

## 2016-09-21 MED ORDER — POLYETHYLENE GLYCOL 3350 17 GRAM ORAL POWDER PACKET
17.0000 g | Freq: Every day | ORAL | Status: DC
Start: 2016-09-21 — End: 2016-09-23
  Administered 2016-09-21: 0 g via GASTROSTOMY
  Administered 2016-09-22: 17 g via GASTROSTOMY
  Filled 2016-09-21 (×3): qty 1

## 2016-09-21 MED ORDER — LEVETIRACETAM 500 MG/5 ML INTRAVENOUS SOLUTION
2000.00 mg | INTRAVENOUS | Status: AC
Start: 2016-09-21 — End: 2016-09-21
  Administered 2016-09-21: 2000 mg via INTRAVENOUS
  Administered 2016-09-21: 0 mg via INTRAVENOUS
  Filled 2016-09-21: qty 20

## 2016-09-21 MED ORDER — SODIUM CHLORIDE 0.9 % (FLUSH) INJECTION SYRINGE
2.0000 mL | INJECTION | INTRAMUSCULAR | Status: DC | PRN
Start: 2016-09-21 — End: 2016-09-27

## 2016-09-21 MED ORDER — ENOXAPARIN 40 MG/0.4 ML SUBCUTANEOUS SYRINGE
40.0000 mg | INJECTION | SUBCUTANEOUS | Status: DC
Start: 2016-09-21 — End: 2016-09-21

## 2016-09-21 MED ORDER — DEXMEDETOMIDINE 400 MCG/100 ML (4 MCG/ML) IN 0.9 % SODIUM CHLORIDE IV
0.5000 ug/kg/h | INTRAVENOUS | Status: DC
Start: 2016-09-21 — End: 2016-09-21

## 2016-09-21 MED ORDER — SODIUM CHLORIDE 0.9 % (FLUSH) INJECTION SYRINGE
2.0000 mL | INJECTION | Freq: Three times a day (TID) | INTRAMUSCULAR | Status: DC
Start: 2016-09-21 — End: 2016-09-27
  Administered 2016-09-21 – 2016-09-27 (×19): 2 mL

## 2016-09-21 MED ORDER — MIDAZOLAM 1 MG/ML INJECTION SOLUTION
0.5000 mg | Freq: Once | INTRAMUSCULAR | Status: DC
Start: 2016-09-21 — End: 2016-09-21

## 2016-09-21 MED ORDER — MIDAZOLAM 1 MG/ML IN 0.9 % SODIUM CHLORIDE INTRAVENOUS
0.50 mg/h | INTRAVENOUS | Status: DC
Start: 2016-09-21 — End: 2016-09-21
  Administered 2016-09-21: 0.5 mg/h via INTRAVENOUS
  Administered 2016-09-22: 0 mg/h via INTRAVENOUS
  Filled 2016-09-21: qty 100

## 2016-09-21 MED ORDER — CEFEPIME 2 GRAM IN 12.5 ML SW SOLUTION FOR INJECTION - IV PUSH
2.0000 g | Freq: Two times a day (BID) | INTRAMUSCULAR | Status: DC
Start: 2016-09-21 — End: 2016-09-23
  Administered 2016-09-21 – 2016-09-22 (×3): 2 g via INTRAVENOUS
  Filled 2016-09-21 (×4): qty 12.5

## 2016-09-21 MED ORDER — VANCOMYCIN 10 GRAM INTRAVENOUS SOLUTION
1500.00 mg | INTRAVENOUS | Status: AC
Start: 2016-09-21 — End: 2016-09-21
  Administered 2016-09-21: 0 mg via INTRAVENOUS
  Administered 2016-09-21 (×2): 1500 mg via INTRAVENOUS
  Filled 2016-09-21: qty 15

## 2016-09-21 MED ORDER — HEPARIN (PORCINE) 5,000 UNIT/ML INJECTION SOLUTION
5000.00 [IU] | Freq: Three times a day (TID) | INTRAMUSCULAR | Status: DC
Start: 2016-09-21 — End: 2016-09-27
  Administered 2016-09-21 – 2016-09-27 (×18): 5000 [IU] via SUBCUTANEOUS
  Filled 2016-09-21 (×21): qty 1

## 2016-09-21 MED ORDER — FENTANYL (PF) 50 MCG/ML INJECTION SOLUTION
50.00 ug | INTRAMUSCULAR | Status: DC | PRN
Start: 2016-09-21 — End: 2016-09-21

## 2016-09-21 MED ORDER — FENTANYL (PF) 50 MCG/ML INTRAVENOUS SOLUTION
0.20 ug/kg/h | INTRAVENOUS | Status: DC
Start: 2016-09-21 — End: 2016-09-21
  Administered 2016-09-21: 0.2 ug/kg/h via INTRAVENOUS
  Administered 2016-09-22: 0 ug/kg/h via INTRAVENOUS
  Filled 2016-09-21: qty 50

## 2016-09-21 MED ORDER — SENNA LEAF EXTRACT 176 MG/5 ML ORAL SYRUP
10.00 mL | ORAL_SOLUTION | Freq: Two times a day (BID) | ORAL | Status: DC
Start: 2016-09-21 — End: 2016-09-23
  Administered 2016-09-21 – 2016-09-22 (×3): 352 mg via GASTROSTOMY
  Administered 2016-09-22: 0 mg via GASTROSTOMY
  Filled 2016-09-21 (×6): qty 15

## 2016-09-21 MED ORDER — VANCOMYCIN 1 GRAM/200 ML IN DEXTROSE 5 % INTRAVENOUS PIGGYBACK
1000.00 mg | INJECTION | INTRAVENOUS | Status: DC
Start: 2016-09-22 — End: 2016-09-25
  Administered 2016-09-22: 0 mg via INTRAVENOUS
  Administered 2016-09-22: 1000 mg via INTRAVENOUS
  Administered 2016-09-23: 0 mg via INTRAVENOUS
  Administered 2016-09-23 – 2016-09-24 (×2): 1000 mg via INTRAVENOUS
  Administered 2016-09-25: 0 mg via INTRAVENOUS
  Administered 2016-09-25: 1000 mg via INTRAVENOUS
  Filled 2016-09-21 (×4): qty 200

## 2016-09-21 MED ORDER — ALBUMIN, HUMAN 5 % INTRAVENOUS SOLUTION
25.0000 g | Freq: Once | INTRAVENOUS | Status: AC
Start: 2016-09-21 — End: 2016-09-21
  Administered 2016-09-21: 25 g via INTRAVENOUS

## 2016-09-21 MED ORDER — MIDAZOLAM 1 MG/ML IN 0.9 % SODIUM CHLORIDE INTRAVENOUS
0.5000 mg/h | INTRAVENOUS | Status: DC
Start: 2016-09-21 — End: 2016-09-23
  Administered 2016-09-21: 4 mg/h via INTRAVENOUS
  Administered 2016-09-21: 5 mg/h via INTRAVENOUS
  Administered 2016-09-21 – 2016-09-22 (×2): 4 mg/h via INTRAVENOUS
  Administered 2016-09-22: 2 mg/h via INTRAVENOUS
  Administered 2016-09-22: 0 mg/h via INTRAVENOUS
  Administered 2016-09-22: 2 mg/h via INTRAVENOUS
  Filled 2016-09-21: qty 100

## 2016-09-21 MED ORDER — LEVETIRACETAM 500 MG TABLET
500.0000 mg | ORAL_TABLET | Freq: Two times a day (BID) | ORAL | Status: DC
Start: 2016-09-21 — End: 2016-09-23
  Administered 2016-09-21 – 2016-09-22 (×4): 500 mg via GASTROSTOMY
  Filled 2016-09-21 (×6): qty 1

## 2016-09-21 MED ORDER — FENTANYL (PF) 50 MCG/ML INJECTION SOLUTION
100.0000 ug | Freq: Once | INTRAMUSCULAR | Status: DC
Start: 2016-09-21 — End: 2016-09-21

## 2016-09-21 MED ORDER — FENTANYL (PF) 50 MCG/ML INJECTION SOLUTION
100.0000 ug | Freq: Once | INTRAMUSCULAR | Status: AC
Start: 2016-09-21 — End: 2016-09-21
  Administered 2016-09-21: 100 ug via INTRAVENOUS
  Filled 2016-09-21: qty 2

## 2016-09-21 MED ORDER — ELECTROLYTE-A INTRAVENOUS SOLUTION BOLUS
1000.0000 mL | Freq: Once | INTRAVENOUS | Status: AC
Start: 2016-09-21 — End: 2016-09-21
  Administered 2016-09-21: 1000 mL via INTRAVENOUS
  Administered 2016-09-21: 0 mL via INTRAVENOUS

## 2016-09-21 MED ORDER — FENTANYL (PF) 50 MCG/ML INJECTION SOLUTION
50.0000 ug | INTRAMUSCULAR | Status: DC | PRN
Start: 2016-09-21 — End: 2016-09-23
  Administered 2016-09-22 (×2): 50 ug via INTRAVENOUS
  Filled 2016-09-21: qty 2

## 2016-09-21 MED ORDER — FENTANYL (PF) 50 MCG/ML INTRAVENOUS SOLUTION
0.2000 ug/kg/h | INTRAVENOUS | Status: DC
Start: 2016-09-21 — End: 2016-09-23
  Administered 2016-09-21: 0.4 ug/kg/h via INTRAVENOUS
  Administered 2016-09-21: 0.2 ug/kg/h via INTRAVENOUS
  Administered 2016-09-21 (×2): 2 ug/kg/h via INTRAVENOUS
  Administered 2016-09-21: 0 ug/kg/h via INTRAVENOUS
  Administered 2016-09-22 (×2): 0.2 ug/kg/h via INTRAVENOUS
  Administered 2016-09-22: 0 ug/kg/h via INTRAVENOUS
  Filled 2016-09-21: qty 50

## 2016-09-21 MED ORDER — FAMOTIDINE 40 MG/5 ML (8 MG/ML) ORAL SUSPENSION
20.0000 mg | INHALATION_SUSPENSION | Freq: Every day | ORAL | Status: DC
Start: 2016-09-21 — End: 2016-09-23
  Administered 2016-09-21 – 2016-09-22 (×2): 20 mg via GASTROSTOMY
  Filled 2016-09-21 (×3): qty 2.5

## 2016-09-21 MED ORDER — SODIUM CHLORIDE 0.9 % INTRAVENOUS SOLUTION
30.0000 mmol | Freq: Once | INTRAVENOUS | Status: AC
Start: 2016-09-21 — End: 2016-09-21
  Administered 2016-09-21: 30 mmol via INTRAVENOUS
  Administered 2016-09-21: 0 mmol via INTRAVENOUS
  Filled 2016-09-21: qty 10

## 2016-09-21 MED ORDER — LEVETIRACETAM 500 MG/5 ML INTRAVENOUS SOLUTION
1500.00 mg | INTRAVENOUS | Status: DC
Start: 2016-09-21 — End: 2016-09-21

## 2016-09-21 MED ORDER — MIDAZOLAM 1 MG/ML INJECTION SOLUTION
0.5000 mg | Freq: Once | INTRAMUSCULAR | Status: DC
Start: 2016-09-21 — End: 2016-09-23
  Administered 2016-09-21: 0 mg via INTRAVENOUS

## 2016-09-21 MED ORDER — METRONIDAZOLE 250 MG TABLET
500.0000 mg | ORAL_TABLET | Freq: Three times a day (TID) | ORAL | Status: DC
Start: 2016-09-21 — End: 2016-09-23
  Administered 2016-09-21 – 2016-09-22 (×5): 500 mg via GASTROSTOMY
  Filled 2016-09-21 (×9): qty 2

## 2016-09-21 MED ORDER — ELECTROLYTE-A INTRAVENOUS SOLUTION BOLUS
500.0000 mL | Freq: Once | INTRAVENOUS | Status: AC
Start: 2016-09-21 — End: 2016-09-21
  Administered 2016-09-21: 500 mL via INTRAVENOUS
  Administered 2016-09-21: 0 mL via INTRAVENOUS

## 2016-09-21 MED ORDER — VANCOMYCIN IV - PHARMACIST TO DOSE PER PROTOCOL
Freq: Every day | Status: DC | PRN
Start: 2016-09-21 — End: 2016-09-25

## 2016-09-21 MED ORDER — CEFEPIME 2 GRAM IN 12.5 ML SW SOLUTION FOR INJECTION - IV PUSH
2.0000 g | Freq: Once | INTRAMUSCULAR | Status: AC
Start: 2016-09-21 — End: 2016-09-21
  Administered 2016-09-21: 2 g via INTRAVENOUS
  Filled 2016-09-21: qty 12.5

## 2016-09-21 MED ORDER — SODIUM CHLORIDE 0.9 % IV BOLUS
1000.0000 mL | INJECTION | Status: AC
Start: 2016-09-21 — End: 2016-09-21
  Administered 2016-09-21: 0 mL via INTRAVENOUS
  Administered 2016-09-21: 1000 mL via INTRAVENOUS

## 2016-09-21 MED ORDER — SENNOSIDES 8.6 MG-DOCUSATE SODIUM 50 MG TABLET
1.0000 | ORAL_TABLET | Freq: Every evening | ORAL | Status: DC | PRN
Start: 2016-09-21 — End: 2016-09-21

## 2016-09-21 MED ORDER — ACYCLOVIR SODIUM 50 MG/ML INTRAVENOUS SOLUTION
10.00 mg/kg | Freq: Three times a day (TID) | INTRAVENOUS | Status: DC
Start: 2016-09-21 — End: 2016-09-21

## 2016-09-21 MED ADMIN — mupirocin 2 % topical ointment: @ 21:00:00 | NDC 51672131200

## 2016-09-21 MED ADMIN — lactated Ringers intravenous solution: GASTROSTOMY | @ 13:00:00

## 2016-09-21 NOTE — ED Nurses Note (Signed)
Consult service from ICU at bedside.

## 2016-09-21 NOTE — Respiratory Therapy (Signed)
Patient remains on PC.  Fio2 decreased to 40%.  Will continue to monitor.    VENTILATOR - PRESSURE CONTROL             Question Answer Comment   FIO2 (%) 40    Peep(cm/H2O) 5    Rate(bpm) 10    Pressure Control(cm/H2O) 16

## 2016-09-21 NOTE — ED Provider Notes (Signed)
[facility] Emergency Department      Attending Physician: Dr. Etheleen Sia  Method of Arrival: EMS   Chief Complaint: Altered Mental Status    History of Present Illness:  [name] is a 83 y.o. female who presents with altered mental status, possibly seizures. Per report, the patient lives in a nursing home and was found by her nurse to be seizing. No prior hx of seizures. She was taken to the local ED, where the physician believed she was having a stroke. She had a negative CT head at the outside facility. She received Keppra and was intubated for airway protection. She received Levaquin for presumed UTI and was transferred here for further evaluation. She has a hx of dementia, COPD, HTN.     Review of Systems:  Unable to obtain secondary to mental status    Medications:  None       Allergies:  No Known Allergies  Allergies reviewed with patient  No past medical history on file.    History reviewed with patient      Social History     Social History   . Marital status: Unknown     Spouse name: N/A   . Number of children: N/A   . Years of education: N/A     Occupational History   . Not on file.     Social History Main Topics   . Smoking status: Not on file   . Smokeless tobacco: Not on file   . Alcohol use Not on file   . Drug use: Not on file   . Sexual activity: Not on file     Other Topics Concern   . Not on file     Social History Narrative     Family Medical History     None              Physical Exam:  All nurse's notes reviewed.  Filed Vitals:    09/21/16 0521 09/21/16 0527 09/21/16 0530 09/21/16 0601   BP: 137/70  137/70 140/69   Pulse: 75  75 78   Resp: 16  (!) 26 20   Temp: 36.1 C (97 F)      SpO2: 100% 100% 100% 100%       Constitutional: Acutely ill, intubated  HENT:   Head: Normocephalic and atraumatic. No external signs of injury  Mouth/Throat: ET tube in place, dry oropharynx  Eyes: Pupils equal, round, reactive. Conjunctivae without discharge or erythema, EOMI  Cardiovascular: Regular rate, rhythm,  without murmurs, gallops, or rubs.   Pulmonary/Chest:Clear and equal breath sounds bilaterally, good air movement. No respiratory distress   Abdominal: BS +. Abdomen soft, without tenderness, rebound or guarding.               Musculoskeletal: No edema, tenderness or obvious deformity.  Skin: warm and dry. No rash, erythema, pallor or cyanosis  Psychiatric: Intubated, unable to assess  Neurological: Alert. Moves all extremities, uncooperative with exam. No gaze deviation, responds to painful stimulation    Abnormal Labs  Labs Reviewed   VENOUS BLOOD GAS/LACTATE - Abnormal; Notable for the following:        Result Value    PH 7.52 (*)     PCO2 31.00 (*)     BICARBONATE 26.7 (*)     LACTATE 3.6 (*)     All other components within normal limits   PTT (PARTIAL THROMBOPLASTIN TIME) - Normal    Narrative:     Therapeutic range  for unfractionated heparin is 60.0-100.0 seconds.   PT/INR - Normal    Narrative:     Coumadin therapy INR range for Conventional Anticoagulation is 2.0 to 3.0 and for Intensive Anticoagulation 2.5 to 3.5.   ADULT ROUTINE BLOOD CULTURE, SET OF 2 BOTTLES (BACTERIA AND YEAST)   ADULT ROUTINE BLOOD CULTURE, SET OF 2 BOTTLES (BACTERIA AND YEAST)   AMMONIA   BASIC METABOLIC PANEL   B-TYPE NATRIURETIC PEPTIDE   CBC/DIFF    Narrative:     The following orders were created for panel order CBC/DIFF.  Procedure                               Abnormality         Status                     ---------                               -----------         ------                     CBC WITH DIFF[[id number]]                                    In process                   Please view results for these tests on the individual orders.   HEPATIC FUNCTION PANEL   LIPASE   ACETAMINOPHEN LEVEL   ETHANOL, SERUM   SALICYLATE ACID LEVEL   TRICYCLIC SCREEN   THYROID STIMULATING HORMONE WITH FREE T4 REFLEX   TROPONIN-I (FOR ED ONLY)   URINALYSIS, MACROSCOPIC AND MICROSCOPIC W/CULTURE REFLEX    Narrative:     The following orders  were created for panel order URINALYSIS, MACROSCOPIC AND MICROSCOPIC W/CULTURE REFLEX.  Procedure                               Abnormality         Status                     ---------                               -----------         ------                     URINALYSIS, MACROSCOPIC[[id number]]                          In process                 URINALYSIS, MICROSCOPIC[[id number]]                          In process                   Please view results for these tests on the individual orders.   CBC WITH DIFF   URINALYSIS, MACROSCOPIC   URINALYSIS, MICROSCOPIC   MAGNESIUM  HGA1C (HEMOGLOBIN A1C WITH EST AVG GLUCOSE)   CREATINE KINASE (CK), TOTAL, SERUM       Imaging:  Results for orders placed or performed during the hospital encounter of 09/21/16 (from the past 72 hour(s))   MOBILE CHEST X-RAY     Status: None    Narrative    [name]  Female, 83 years old.    XR AP MOBILE CHEST performed on 09/21/2016 5:50 AM.    REASON FOR EXAM:  seizure    TECHNIQUE: 1 views/1 images submitted for interpretation.    COMPARISON:  None.          Impression    Lungs are underinflated with airspace opacification identified at the left  lung base representing fluid and atelectasis or consolidation. There is no  pneumothorax. The heart is at the upper limits of normal for size. No   acute  osseous abnormalities are detected. Endotracheal tube projects 4.5 cm   above  the level of the carina.     XR AP MOBILE CHEST     Status: None    Narrative    [name]  Female, 83 years old.    XR AP MOBILE CHEST performed on 09/21/2016 11:36 AM.    REASON FOR EXAM:  OG    TECHNIQUE: 1 views/2 images submitted for interpretation.    COMPARISON: September 21, 2016 at 0537 hours    FINDINGS  : There is a shallow inspiration.    LUNGS:  Clear.    HEART AND VASCULATURE:  The heart is mildly enlarged and appears unchanged.  No vascular congestion or interstitial edema is visible.    PLEURA:  No pleural effusion or pneumothorax.    SUPPORT DEVICES:   Endotracheal tube ends 3.5 cm above the level of the  carina.  Enteric tube in place with tip in the area of the body of the stomach.      Impression    1. Placement of enteric tube with tip in the area of the body of the  stomach.  2. Shallow inspiration.  3. Mild cardiac enlargement, no evidence of pulmonary edema.     XR AP MOBILE CHEST     Status: None    Narrative    XR AP MOBILE CHEST performed on 09/22/2016 8:07 AM    INDICATION: 83 years old Female  Hx of seizures and AMS s/p intubation - eval ET placement and acute lung  process    TECHNIQUE: 1 views of the chest; 1 images    COMPARISON: Chest radiograph dated September 21, 2016    FINDINGS: Minimal atelectasis is noted at the medial aspect of the left  lung base.  No pleural effusion or pneumothorax is visible.  The heart   size  is at the upper limits of normal.  The pulmonary vasculature is  unremarkable.    Endotracheal tube projects 4 cm above the carina.  Enteric tube sidehole projects over the stomach.        Impression    Minimal left lung base atelectasis         EKG: Normal sinus rhythm, normal EKG    Medical Decision Making/ Emergency Department Course  This patient presents with new onset seizures, altered mental status. On arrival she intubated but awake, responds to stimulation with purposeful movement. I do not think she is seizing at this time. Neurology was consulted and performed an EEG, negative for subclinical status. She was given Levaquin at the outside facility, and received  additional antibiotics here in the ED. She was started on fentanyl and versed for sedation. She likely has a pneumonia and UTI. She was admitted to the MICU for further treatment    Impression: New onset seizure, altered mental status  Disposition:    Patient will be admitted to MICU service for further evaluation and management       After discussing patient's HPI, Physical Examination, ED course and plan, patient was signed out and care transferred to Dr. Cleone Slim,  PGY3,. Please see the Resident Course Note for further patient care information.        I personally performed the services described in this documentation and it is both accurate  and complete.  Makail Watling Z. Elodia Florence, MD 09/21/2016, 06:08  PGY III  [facility]

## 2016-09-21 NOTE — ED Nurses Note (Signed)
ECG at bedside with neuro. Pt's family updated. Pt resting in bed.

## 2016-09-21 NOTE — H&P (Signed)
[facility]  MICU History and Physical    [name],[name], 83 y.o. female  Date of Admission:  09/21/2016  Date of service: 09/21/2016  Date of Birth:  11-Aug-1934    PCP: No primary care provider on file.    Information Obtained from: history reviewed via medical record  Chief Complaint:  Seizure like activity    HPI: [name] is a 83 y.o., White female who presents with a seizure like episode. PMH of COPD, Dementia, schizophrenia, HTN. Patient initially brought to outside facility after she was thought to be having seizures at her nursing facility. On arrival, patient was hypoxic, unresponsive, and consequently intubated. There was concern from outside facility that she may be having a stroke however CT stroke was negative. Labs most notable for a lactate of 10. She was started on Levaquin for possible UTI and transferred to [facility]. On arrival, patient evaluated by neurology and by initial assessment did not appear to be having seizures, however subsequent EEG showed seizure like activity .    On assessment in ED, patient is averbal with spontaneous eye opening. She will not follow commands. She actively will fight pupil examination, c/w a psychiatric component to her state.     Per the record, patient was previously able to performs her ADLs until her son committed suicide and her status then declined. She has a history of a suicide attempt herself, mechanism not know. Her baseline is averbal and non-responsive.     Past Medical History:   Diagnosis Date    COPD (chronic obstructive pulmonary disease) (HCC)     Dementia     HTN (hypertension)     Schizophrenia (HCC)          PSH: Unknown        Medications Prior to Admission     Prescriptions    ciprofloxacin HCl (CIPRO) 500 mg Oral Tablet    Take 500 mg by mouth Twice daily    levoFLOXacin (LEVAQUIN) 500 mg Oral Tablet    Take 500 mg by mouth Once a day    pantoprazole (PROTONIX) 20 mg Oral Tablet,  Delayed Release (E.C.)    Take 20 mg by mouth Every morning before breakfast          Current Facility-Administered Medications:  cefepime (MAXIPIME) IV injection 2 g 2 g Intravenous Q12H   famotidine (PEPCID) 40mg  per 5mL oral liquid 20 mg Gastric (NG, OG, PEG, GT) Daily   fentaNYL (SUBLIMAZE) 50 mcg/mL (tot vol 50 mL) infusion 0.2 mcg/kg/hr (Order-Specific) Intravenous Continuous   fentaNYL (SUBLIMAZE) 50 mcg/mL injection 50 mcg Intravenous Q15 Min PRN   heparin 5,000 unit/mL injection 5,000 Units Subcutaneous Q8HRS   levETIRAcetam (KEPPRA) tablet 500 mg Gastric (NG, OG, PEG, GT) 2x/day   metroNIDAZOLE (FLAGYL) tablet 500 mg Gastric (NG, OG, PEG, GT) Q8HRS   midazolam (VERSED) 1 mg/mL injection 0.5 mg Intravenous Once   midazolam (VERSED) 100 mg in NS 100 mL premix infusion 0.5 mg/hr Intravenous Continuous   NS flush syringe 2 mL Intracatheter Q8HRS   And      NS flush syringe 2-6 mL Intracatheter Q1 MIN PRN   polyethylene glycol (MIRALAX) oral packet 17 g Gastric (NG, OG, PEG, GT) Daily   potassium phosphate 30 mmol in NS 500 mL IVPB 30 mmol Intravenous Once   senna concentrate (SENNA) 528mg  per 15mL oral liquid 10 mL Gastric (NG, OG, PEG, GT) 2x/day   Vancomycin IV - Pharmacist to Dose per Protocol  Does not apply Daily  PRN       Allergies   Allergen Reactions    Sulfa (Sulfonamides)     Bactrim [Sulfamethoxazole-Trimethoprim] Swelling    Penicillins Swelling       Family History  Unknown    Social History  Unable to obtain from patient due to AMS    ROS:   Review of systems was not obtained due to AMS .    EXAM:  Temperature: 37.5 C (99.5 F)  Heart Rate: 73  BP (Non-Invasive): (!) 111/56  Respiratory Rate: 16  SpO2-1: 100 %  Constitutional: appears chronically ill and no distress  Eyes: Opens eyes spontaneously, fights eye exam  ENT: Mouth mucous membranes moist.   Neck: ET securely in place  Respiratory: Clear to auscultation bilaterally.   Cardiovascular: regular rate and rhythm  Gastrointestinal: Soft,  non-tender, Bowel sounds normal  Genitourinary: Deferred  Musculoskeletal: Head atraumatic and normocephalic  Integumentary:  Skin warm and dry  Neurologic: AOx0. Will not follow commands however likely a refusal rather an inability  Lymphatic/Immunologic/Hematologic: No lymphadenopathy  Psychiatric: Cannot assess    Glasgow: Eye opening: 4 spontaneous, Verbal resonse:  1 no response, Best motor response:  4 flexion withdrawal  Coma (GCS less than 8): Coma -Not applicable    Labs:    I have reviewed all lab results.    Imaging Studies:      CXR- cardiomegaly, possible lingual consolidation    DNR Status:  Full Code    Assessment/Plan:  Active Hospital Problems    Diagnosis    Primary Problem: Altered mental status    Seizure (HCC)    Hypophosphatemia     [name] is a 83 y.o. female who presents with seizure like activity confirmed by EEG and UTI, intubated due to hypoxia. She is clinically stable. NCCU following. From documentation, her AMS may be her baseline. Will continue to treat UTI, seizures, and follow NCCU recs    Seizure   -witness seizure like activity at nursing home  -CT stroke protocol at outside facility negative  -Initial eval by NCCU determined no seizures but EEG demonstrate seizure like activity  -Initial elevated lactate but now only 2.5  -CK WNL  -Received load dose of Keppra in ED. Continue 500 mg BID  -Versed drip  -Seizure precautions, neuro checks   -NCCU following, appreciate recs  -MRI w/wo tonight    Pneumonia // Hypoxic respiratory failure  -intubation due to poor O2 saturation, likely multifactorial due to seizure-like activity as well  -no leukocytosis, outside facility procal negative  -CXR showed lingual consolidation, possible PNA  -clinically stable  -Vanc/cefepime/flagyl. May have aspirated with seizure  -Procal pending  -CRP pending  -urine strep/legionella  -blood cultures  -Flu/biofire negative  -BAL - cell count and culture    UTI  -UA showed moderate leuks, many bacteria.  Culture pending  -unclear contribution to AMS  -Clinically stable  -Continue broad spectrum abx as above    Elevated INR  -likely 2/2 to poor nutrition  -low suspicion of DIC/TTP, no hx of cirrhosis  -Consider Vitamin K replacement     AMS  -hx of dementia/Schizophrenia   -baseline from report is averbal 2/2 to psychiatric disease with contributing dementia  -UTI in this setting may also reduce baseline mentation  -B12/folate/TSH WNL  -urine drug screen normal    Hypophosphatemia  -replete as needed    DVT/PE Prophylaxis: Enoxaparin    Darl Pikes, MD, PhD  Transitional Year PGY-1  Department of Graduate Medical Education  Pager# [phone]    I saw and examined the patient, personally performed the critical or key portions of the service and discussed patient management with the ICU team including the resident, fellow, nurse and ancillary staff. Patient was examined during morning rounds. I reviewed the resident's note and agree with the history, findings and plan of care. Exceptions are edited/noted above. Laboratory, hemodynamic, respiratory support, and radiological data were also reviewed and discussed.     I saw and evaluated the patient. I reviewed the resident's note. I agree with the findings and plan of care as documented in the resident's note. Any exceptions/additions are noted above.  Steele BergJohn E. Kanon Novosel, M.D.  Critical care time 77 minutes

## 2016-09-21 NOTE — Pharmacy Vancomycin Dosing (Signed)
[facility] Department of Pharmaceutical Services  Therapeutic Drug Monitoring: Vancomycin  09/21/2016      Patient name: Cleatrice BurkeScott,Verneice  Date of Birth:  04/24/1934    Actual Weight:  Weight: 88.5 kg (195 lb 1.7 oz) (09/21/16 0521)     BMI:       Date RPh Current regimen (including mg/kg) Indication Target Levels (mcg/mL) SCr (mg/dL) CrCl* (mL/min) Measured level (mcg/mL) Plan (including when levels are due) Comments   1/12 jmq  empiric 13-17 0.69   Vancomycin1500mg x1,Cefepime 2gmx1 [facility] from a [facility] home due to seizures , pt became unresponsive and hypoxic upon her arrival   1/12 Tennyson Wacha Vancomycin 1500 mg IV x 1 in ED  Empiric coverage of pneumonia 13 0.64 ~50; making urine n/a - Continue vancomycin 1000 mg IV every 24 hours   - To start tomorrow morning  - Monitor SCr and UOP   - Deescalate as able - WBC slightly elevated  - Urine and blood cx pending                                                                  *Creatinine clearance is estimated by using the Cockcroft-Gault equation for adult patients and the Brendolyn PattySchwartz equation for pediatric patients.    The decision to discontinue vancomycin therapy will be determined by the primary service.  Please contact the pharmacist with any questions regarding this patient's medication regimen.

## 2016-09-21 NOTE — ED Resident Handoff Note (Signed)
Allergies   Allergen Reactions   . Sulfa (Sulfonamides)    . Bactrim [Sulfamethoxazole-Trimethoprim] Swelling   . Penicillins Swelling       Filed Vitals:    09/21/16 0530 09/21/16 0601 09/21/16 0615 09/21/16 0619   BP: 137/70 140/69 137/65    Pulse: 75 78 80    Resp: (!) 26 20 (!) 30    Temp:       SpO2: 100% 100% 100% 100%       HPI:  [name] is a 83 y.o. female presents with seizure. Transfer from [place]. Came from nursing home. Intubated at outside facility due to hypoxia and unresponsiveness. Stroke paged, CT head negative. Received Levaquin and Keppra at outside facility.     Pertinent Exam Findings:  Intubated     Pertinent Imaging/Lab results:  Labs Reviewed   HEPATIC FUNCTION PANEL - Abnormal; Notable for the following:        Result Value    ALBUMIN 2.8 (*)     BILIRUBIN DIRECT 0.3 (*)     All other components within normal limits   ACETAMINOPHEN LEVEL - Abnormal; Notable for the following:     ACETAMINOPHEN LEVEL <5 (*)     All other components within normal limits   TROPONIN-I (FOR ED ONLY) - Abnormal; Notable for the following:     TROPONIN I 97 (*)     All other components within normal limits   VENOUS BLOOD GAS/LACTATE - Abnormal; Notable for the following:     PH 7.52 (*)     PCO2 31.00 (*)     BICARBONATE 26.7 (*)     LACTATE 3.6 (*)     All other components within normal limits   URINALYSIS, MACROSCOPIC - Abnormal; Notable for the following:     PROTEIN 100  (*)     BILIRUBIN Small (*)     UROBILINOGEN 4.0   (*)     BLOOD Small (*)     LEUKOCYTES Moderate (*)     APPEARANCE Turbid (*)     All other components within normal limits    Narrative:     False positive urine bilirubin results may be caused by some drugs, drug related products and color of urine sample.  Correlate positive bilirubin results with clinical history. A total serum bilirubin is suggested if false positive results are suspected.   URINALYSIS, MICROSCOPIC - Abnormal; Notable for the following:     WBCS 18.0 (*)     RBCS 12.0  (*)     BACTERIA Many (*)     HYALINE CASTS 132.0 (*)     CALCIUM OXALATE CRYSTALS Many (*)     MUCOUS Heavy (*)     All other components within normal limits   AMMONIA - Normal    Narrative:     Hemolysis can alter results at this level (mild).   BASIC METABOLIC PANEL - Normal   B-TYPE NATRIURETIC PEPTIDE - Normal   LIPASE - Normal   PTT (PARTIAL THROMBOPLASTIN TIME) - Normal    Narrative:     Therapeutic range for unfractionated heparin is 60.0-100.0 seconds.   PT/INR - Normal    Narrative:     Coumadin therapy INR range for Conventional Anticoagulation is 2.0 to 3.0 and for Intensive Anticoagulation 2.5 to 3.5.   SALICYLATE ACID LEVEL - Normal   TRICYCLIC SCREEN - Normal   THYROID STIMULATING HORMONE WITH FREE T4 REFLEX - Normal   MAGNESIUM - Normal   CREATINE KINASE (  CK), TOTAL, SERUM - Normal   ADULT ROUTINE BLOOD CULTURE, SET OF 2 BOTTLES (BACTERIA AND YEAST)   ADULT ROUTINE BLOOD CULTURE, SET OF 2 BOTTLES (BACTERIA AND YEAST)   URINE CULTURE   ETHANOL, SERUM   URINALYSIS, MACROSCOPIC AND MICROSCOPIC W/CULTURE REFLEX    Narrative:     The following orders were created for panel order URINALYSIS, MACROSCOPIC AND MICROSCOPIC W/CULTURE REFLEX.  Procedure                               Abnormality         Status                     ---------                               -----------         ------                     URINALYSIS, MACROSCOPIC[[id number]]      Abnormal            Final result               URINALYSIS, MICROSCOPIC[[id number]]      Abnormal            Final result                 Please view results for these tests on the individual orders.   CBC/DIFF    Narrative:     The following orders were created for panel order CBC/DIFF.  Procedure                               Abnormality         Status                     ---------                               -----------         ------                     CBC WITH DIFF[[id number]]                                    In process                   Please view  results for these tests on the individual orders.   CBC WITH DIFF   HGA1C (HEMOGLOBIN A1C WITH EST AVG GLUCOSE)       Results for orders placed or performed during the hospital encounter of 09/21/16 (from the past 72 hour(s))   MOBILE CHEST X-RAY     Status: None (Preliminary result)    Narrative    [name]  Female, 83 years old.    XR AP MOBILE CHEST performed on 09/21/2016 5:50 AM.    REASON FOR EXAM:  seizure    TECHNIQUE: 1 views/1 images submitted for interpretation.    COMPARISON:  None.          Impression    Lungs  are underinflated with airspace opacification identified at the left  lung base representing fluid and atelectasis or consolidation. There is no  pneumothorax. The heart is at the upper limits of normal for size. No acute  osseous abnormalities are detected. Endotracheal tube projects 4.5 cm above  the level of the carina.         Pending Studies:  None    Consults:  Neurology   Internal medicine    Plan:  Abx  Intubated-versed/fentanyl   Plan to admit to MICU     After a thorough discussion of the patient including presentation, ED course, and review of above information I have assumed care of [name] from Dr. Elodia FlorenceShamsi at 06:47      [name] Minaourtney A Lizzett Nobile, MD 09/21/2016, 06:47      Course:  Antibiotics ordered by Dr. Elodia FlorenceShamsi. No further intervention required while in ED. Patient admitted to MICU service for further evaluation and management.     Disposition:    Patient will be admitted to MICU service for further evaluation and management.          [name] Minaourtney A Jaylen Knope, MD 09/21/2016, 06:47  PGY 3  [facility] Department of Emergency Medicine

## 2016-09-21 NOTE — ED Nurses Note (Signed)
Patient cleaned, repositioned, sheets changed and oral care performed. Physician at bedside for central line placement.

## 2016-09-21 NOTE — Incoming ED Transfer Note (Signed)
Pt being transferred from [facility] pt reportedly was coming to ED from a nursing home due to seizures , pt became unresponsive and hypoxic upon her arrival.  Pt reportedly had bilat pupils deviating to the right.  Pt was stroke paged and sent to CT, CT of head was negative, pt was intubated upon arrival ET tube is 7.5  At the lip, pt has 20g L hand and 20g  Rt hand .  Pt was given 10mg  of vec, 10mg  of Etomidate, 750mg  of levoquin, 1mg   Keppra, pt is on a versed drip 0.5mg /kg/ hr.  Pt has foley cath.  Pt bp 144/87, 99.2, and hr 67.  Pt coming by ground

## 2016-09-21 NOTE — Nurses Notes (Signed)
No family at bedside. Unit nurse will call when daughter calls back to clear for MRI

## 2016-09-21 NOTE — ED Nurses Note (Signed)
EEG in progress 

## 2016-09-21 NOTE — Incoming ED Transfer Note (Signed)
00:53    [facility] Facility[facility]  [facility]      HPI:  Resident of nursing home, found seizing at 1025 pm, tonic clonic seizure, likely aspirated, arrived at ED being bagged by ems gasping for air, intubated. Ct negative bleed. Pupils 1 mm non reactive no change with 2 mg narcan. Has uti, got levoquin.  On versed drip. bp intially 140 systolic then became hypertensive 209 systolic over 126 diastolic.  Got labetolol bp in 170's systolic.  No fever. Hx of copd, dementia and schizophrenia.  Got 1 gm keppra, eyes were intially deviated to r? Not any more seems to be waking up.  Full code. ekg nsr. Initial lactate 10.7.       Imaging:     CT Head:  No Bleed        Mode of Transport:  ImmunologistGround or Flight,     Not a page

## 2016-09-21 NOTE — Consults (Signed)
[facility]  Neurology Initial Consult    EdcouchScott,[name], 83 y.o. female  Date of Admission:  09/21/2016  Date of Birth:  11/06/1933    PCP: No primary care provider on file.    Information obtained from: health care provider and history reviewed via medical record  Chief Complaint:  Seizure, intubated    HPI:[name] Lorin PicketScott is a 83 y.o.,  female with PMH with no history of seizure, who is transferred from outside facility intubated and sedating after having a witness with 2130 GTC lasting 2 eyes deviating to the right. GCS 3 at outside, intubated/sedated, given 1 g of Keppra.     Baseline was able to walk, talk, dress and feed herself until son passed away in August and she stopped talking, walking and was able to take care of herself, she also attempted suicide after the death of her son.      On arrival, patient remains intubated, not following simple command, looking around the room. Concern for postictal vs subclinical status. STAT EEG ordered.     PMH: Dementia, COPD, schizophrenia affective d/o, anemia, hypokalemia, GERD, deppresion   PSH: n/c   MED: ASA 81 mg, ditropan, Flomax, folate acid, Lipitor, latuda, Norvasc, potassium, Protonix, singular, trazodone, vistaril, xanax 1 mg BID   ALL: penicillin, sulfa   FH: n/c   SH: n/c       Admission Source:  Transfer from another hospital [facility]  Advance Directives:  None-Discussed  Hospice involvement prior to admission?  Not applicable    Location (of pain): Quality (character of pain) Severity (minimal, mild, severe, scale or 1-10) Duration (how long has pain/sx present) Timing (when does pain/sx occur)  Context (activity at/before onset) Modifying Factors (what makes pain/sx  Better/worse) Associate Sign/Sx (what accompanies main pain/sx)    Past Medical History:   Diagnosis Date    COPD (chronic obstructive pulmonary disease) (HCC)     Dementia     HTN (hypertension)     Schizophrenia (HCC)              Medications Prior to Admission     None           Allergies   Allergen Reactions    Sulfa (Sulfonamides)     Bactrim [Sulfamethoxazole-Trimethoprim] Swelling    Penicillins Swelling     Social History   Substance Use Topics    Smoking status: Not on file    Smokeless tobacco: Not on file    Alcohol use Not on file     Family Medical History     None              ROS: Review of systems was not obtained due to intubated .    Exam:  Temperature: 36.1 C (97 F)  Heart Rate: 80  BP (Non-Invasive): 137/65  Respiratory Rate: (!) 30  SpO2-1: 100 %  General: acutely ill  ZOX:WRUEENT:ENMT without erythema or injection, mucous membranes moist.  Neck: no thyromegaly or lymphadenopathy  Carotids:Carotids normal without bruit  Respiratory: Clear to auscultation bilaterally.   Cardiovascular: regular rate and rhythm  Gastrointestinal: Soft, non-tender  Genitourinary: Deferred  Lymphatic/Immunologic/Hematologic: No lymphadenopathy  Musculoskeletal: Head atraumatic and normocephalic  Glasgow:  Eye opening: 4 spontaneous, Verbal resonse:  2 incomprehensible sounds, Best motor response:  4 flexion withdrawal  Mental status:  Level of Consciousness: drowsy  Orientations: unable to test due to being intubated   Memory: unable to test due to being intubated, does not follow  command   Attentions unable to test due to being intubated   Knowledge: unable to test due to being intubated   Language: unable to test due to being intubated, does not follow simple command   Speech: unable to test due to being intubated   Cranial nerves:   CN2: blink to threat bilateral   CN 3,4,6: Pupils reactive to light, able to track across the room   CN 7 Face symmetrical, however difficult to accurately assess due to being intubated   CN 8: unable to test due to being intubated   CN 9,10: Gag intact   CN 12: unable to test due to being intubated   Gait, Coordination, and Reflexes:   Gait: Unable to ambulate and Unable to cooperate  Coordination: No resting tremor     Muscle tone: WNL  Muscle  exam    Reflexes   RJ BJ TJ KJ AJ Plantars Hoffman's   Right 2+ 2+ 2+ 2+ 2+ Downgoing Not present   Left 2+ 2+ 2+ 2+ 2+ Downgoing Not present     Sensory: grossly intact, grimaces to noxious stimulus   Integumentary: Skin warm and dry      Labs:    I have reviewed all lab results.  Lab Results Today:    Results for orders placed or performed during the hospital encounter of 09/21/16 (from the past 24 hour(s))   PTT (PARTIAL THROMBOPLASTIN TIME)   Result Value Ref Range    APTT 27.7 25.1 - 36.5 seconds   PT/INR   Result Value Ref Range    PROTHROMBIN TIME 13.4 9.3 - 13.9 seconds    INR 1.16 0.80 - 1.20   AMMONIA   Result Value Ref Range    AMMONIA 32 15 - 50 umol/L   BASIC METABOLIC PANEL, NON-FASTING   Result Value Ref Range    SODIUM 141 136 - 145 mmol/L    POTASSIUM 3.8 3.5 - 5.1 mmol/L    CHLORIDE 107 96 - 111 mmol/L    CO2 TOTAL 22 22 - 32 mmol/L    ANION GAP 12 4 - 13 mmol/L    CALCIUM 9.3 8.5 - 10.2 mg/dL    GLUCOSE 161 65 - 096 mg/dL    BUN 10 8 - 25 mg/dL    CREATININE 0.45 4.09 - 1.10 mg/dL    BUN/CREA RATIO 14 6 - 22    ESTIMATED GFR >59 >59 mL/min/1.38m2   B-TYPE NATRIURETIC PEPTIDE   Result Value Ref Range    BNP 50 <=100 pg/mL   HEPATIC FUNCTION PANEL   Result Value Ref Range    ALBUMIN 2.8 (L) 3.4 - 4.8 g/dL    ALKALINE PHOSPHATASE 108 <150 U/L    ALT (SGPT) 11 <55 U/L    AST (SGOT) 12 8 - 41 U/L    BILIRUBIN TOTAL 0.6 0.3 - 1.3 mg/dL    BILIRUBIN DIRECT 0.3 (H) <0.3 mg/dL    PROTEIN TOTAL 7.0 6.0 - 8.0 g/dL   LIPASE   Result Value Ref Range    LIPASE 16 10 - 80 U/L   ETHANOL, SERUM   Result Value Ref Range    ETHANOL <10 <10 mg/dL    ETHANOL None Detected    TROPONIN-I (FOR ED ONLY)   Result Value Ref Range    TROPONIN I 97 (HH) 0 - 30 ng/L   VENOUS BLOOD GAS/LACTATE   Result Value Ref Range    %FIO2 100.0 %    PH 7.52 (HH) 7.31 -  7.41    PCO2 31.00 (L) 41.00 - 51.00 mm/Hg    PO2 36.0 35.0 - 50.0 mm/Hg    BASE EXCESS 3.0 -3.0 - 3.0 mmol/L    BICARBONATE 26.7 (H) 22.0 - 26.0 mmol/L    LACTATE 3.6 (H)  0.0 - 1.3 mmol/L   URINALYSIS, MACROSCOPIC   Result Value Ref Range    SPECIFIC GRAVITY 1.029 1.005 - 1.030    GLUCOSE Negative Negative mg/dL    PROTEIN 161  (A) Negative mg/dL    BILIRUBIN Small (A) Negative mg/dL    UROBILINOGEN 4.0   (A) Negative mg/dL    PH 5.0 5.0 - 8.0    BLOOD Small (A) Negative mg/dL    KETONES Negative Negative mg/dL    NITRITE Negative Negative    LEUKOCYTES Moderate (A) Negative WBCs/uL    APPEARANCE Turbid (A) Clear    COLOR Normal (Yellow) Normal (Yellow)   URINALYSIS, MICROSCOPIC   Result Value Ref Range    WBCS 18.0 (H) <11.0 /hpf    RBCS 12.0 (H) <6.0 /hpf    BACTERIA Many (A) Occasional or less /hpf    HYALINE CASTS 132.0 (H) <4.0 /lpf    CALCIUM OXALATE CRYSTALS Many (A) Few or less /hpf    MUCOUS Heavy (A) Light /lpf   MAGNESIUM   Result Value Ref Range    MAGNESIUM 1.8 1.6 - 2.5 mg/dL   CREATINE KINASE (CK), TOTAL, SERUM   Result Value Ref Range    CREATINE KINASE 58 25 - 190 U/L       Review of reports and notes reveal:   Independent Interpretation of images or specimens:      Assessment/Plan:  Active Hospital Problems    Diagnosis    Seizure (HCC)       SEIZURES   Etiology: Provoke 2/2 Urosepsis vs Xanax withdrawal vs Unprovoked   - Admit to MICU for infectious work up, NCCU to follow.   - Routine inpatient EEG to r/o subclinical status.   - MRI brain w/wo to rule out primary causes for seizure when able   - CBC, BMP, CK, LFT's, urine drug screen, FSBG, O2, EKG  - If glucose low, give thiamine 100 mg IV, then D50 50 mL IV  - NPO  - Seizure precautions, Aspiration precautions,Telemetry, Neuro checks, Vitals.   - PT/OT when able   - Loaded with Keppra 2000 mg in ED, start Keppra 500 mg BID for maintenance until primary/secondary epilepsy ruled out  - If patient start having a seizure, please give 4 mg IV ativan over 2 mins, can be repeated in 5 minutes if seizure does not stop.    - Please avoid epileptogenic medications like imipenem, Ultram         Palliative/Supportive Care  consulted?  no  Hospice Consulted?  Not applicable    Current Comorbid Conditions - Neurology Consult  Brain Compression:  no  Obstructive Hydrocephalus:  no  GCS less than 8: Coma -Not applicable  TIA not applicable  Cerebral Edema:  no  Encephalopathy:  yes  Encephalitis-Not applicable  Seizure-[place]  Other   Respiratory Failure/Other-Not applicable  Coagulopathy Not applicable    Kathyrn Sheriff, MD      Late entry for 09/24/16. I saw and examined the patient.  I reviewed the resident's note.  I agree with the findings and plan of care as documented in the resident's note.  Any exceptions/additions are edited/noted.    Georgina Peer, MD

## 2016-09-21 NOTE — Ancillary Notes (Signed)
[facility]  Neurolab Tech Note    [name]  DATE OF SERVICE:  09/21/2016          EEG has been completed.      [name] MoodyStephanie Keyon [name], [facility]

## 2016-09-21 NOTE — Procedures (Signed)
[name]:  [facility], [facility]   HOSPITAL NUMBER:  [id number]  DATE OF SERVICE:  09/21/2016  DOB:  01/31/1934  SEX:  F      Date of Study:  September 21, 2016.     EEG#:  [id number].    REFERRING PHYSICIAN:  Rennie Plowmanharles Whiteman, MD.    HISTORY:  Not given.  Concern for subclinical status.  Medications not listed.    REPORT:  The EEG is performed using the international 10-20 placement system.  At the beginning of recording, the patient is lying with her head to the left.  She is intubated.  Eyes are closed.  Background is moderate amplitude and lacks normal gradient.  Dominant frequency is 4-5 Hz.  Hemispheres are roughly symmetric.  She has some head movement and eye blinking noted.  There is also ongoing rhythmic 3.5 Hz slowing seen in the frontal polar leads that looks like eye movement.  In looking at the video, it does look like she is having very rapid eye blinking.  There is not actually other changes seen in the EEG background.  At other times, the rhythmic slowing does not seem to correlate with eye blinks.  However, it does not evolve and the video was somewhat blurred, so we cannot exclude eye blinks.     The record seems to quiet down after about 9 minutes.  At 26 minutes into the recording, the rhythmic 4 Hz activity starts again.  Viewing this on a Laplacian montage showed it to be of highest amplitude at CZ/F3, C3/P3.     Once again, checking the video does not show blinking.     On the EKG monitor, the heart rate is regular at 66 beats per minute.  When the rhythmic slowing becomes more generalized again, the patient just seems to be staring.  Twenty seconds after that, she starts blinking which is a different pattern seen just at Fp1 and Fp2 on the Laplacian than the other rhythmic slowing.     Photic and hyperventilation were omitted.     The recording quiets down again by 40 minutes into it and no further changes are noted.    IMPRESSION:  The rhythmic slowing was difficult to discriminate from eye blinking, but  there do seem to be different patterns to the eye blinking in the bifrontal rhythmic slowing.  The latter may, therefore, represent a deep seizure focus in the medial frontal region.  Neuroimaging and clinical correlation is suggested.  A trial of antiepileptic drugs is suggested.     Note:  Absence status is not considered to be a dangerous condition.     The duration of this recording was 47 minutes.        Oliva BustardBarbara E Mariel Lukins, MD          CC:   Ezzie Duralharles R Whiteman, MD   Fax: [phone]       DD:  09/21/2016 16:11:51  DT:  09/21/2016 16:35:55 TM  D#:  [id number]

## 2016-09-21 NOTE — Nurses Notes (Signed)
Cleared and consented with daughter/MPOA Harriette BouillonJoyce Bazzi [phone]. Set up with respiratory for 0100. Bedside nurse aware.

## 2016-09-21 NOTE — Nurses Notes (Signed)
09/21/16 1920 09/21/16 1925   Vital Signs   BP (Non-Invasive) (!) 95/40 (!) 95/43   MAP (Non-Invasive) 57 mmHG 60 mmHG     MICU resident notified of pt's decreasing BP/MAP from 120s-130s/70s. Fentanyl gtt stopped. Resident advised of lack of central access and low UO. Will continue to monitor.

## 2016-09-21 NOTE — ED Nurses Note (Signed)
Pt's PTA foley changed.

## 2016-09-21 NOTE — Pharmacy Vancomycin Dosing (Signed)
[facility] Department of Pharmaceutical Services  Therapeutic Drug Monitoring: Vancomycin  09/21/2016      Patient name: [name],[name]  Date of Birth:  12/18/1933    Actual Weight:  Weight: 88.5 kg (195 lb 1.7 oz) (09/21/16 0521)     BMI:       Date RPh Current regimen (including mg/kg) Indication Target Levels (mcg/mL) SCr (mg/dL) CrCl* (mL/min) Measured level (mcg/mL) Plan (including when levels are due) Comments   1/12 jmq  empiric 13-17 0.69   Vancomycin1500mg x1,Cefepime 2gmx1 [facility] was coming [facility] ED from a nursing home due to seizures , pt became unresponsive and hypoxic upon her arrival                                                                             *Creatinine clearance is estimated by using the Cockcroft-Gault equation for adult patients and the Brendolyn PattySchwartz equation for pediatric patients.    The decision to discontinue vancomycin therapy will be determined by the primary service.  Please contact the pharmacist with any questions regarding this patient's medication regimen.

## 2016-09-21 NOTE — Procedures (Signed)
[name] Picket:  [name]   HOSPITAL NUMBER:  [id number]  DATE OF SERVICE:  09/21/2016  DOB:  [birth year]  SEX:  F      Date of Study:  September 21, 2016.   EEG #:  [id number].   Technician:  JS.    REQUESTING PHYSICIAN:  Rennie Plowmanharles Whiteman, MD.    HISTORY:  This is an 83 year old woman with history of seizure presenting with respiratory failure after a generalized tonic clonic seizure.    REPORT:  This is a digitally acquired EEG following the standard 10-20 system of electrode placement.  This EEG runs from September 21, 2016, from 7:15  a.m. to 8:01 a.m., lasting  46 minutes.  The patient was later placed on extended video EEG monitoring.    Background of the EEG shows diffuse delta and theta slowing that appears symmetrical with maximum frequency measured at approximately 9 Hz.  There are 3-4 episodes of rhythmic frontal sharp waves of theta frequency.  There is no clear evolution or spread or after slowing effect of the burst.  There was no clear sleep architecture seen.  Photic stimulation and hyperventilation were omitted secondary to patient's condition.      INTERPRETATION:  This is an abnormal routine EEG due to the presence of diffuse delta and theta slowing suggestive of generalized nonspecific encephalopathy.  The presence of bursts of rhythmic frontal sharp waves does not appear ictal in nature but seizure cannot rule out at this time, would recommend extended study to further characterize.        Walker KehrQuynh Vo, MD    I have reviewed the EEG and agree with the findings of this report. Any additions, exceptions or clarifications are noted above.    Hosie Poissonavid Daivion Pape, M.D.  Associate Professor  Department of Neurology          DD:  09/21/2016 16:57:04  DT:  09/21/2016 18:04:27 MK  D#:  [id number]

## 2016-09-21 NOTE — Nurses Notes (Signed)
[facility]. Pt on versed and fentanyl gtt. Pt vss per doc flow. Pt GCS 4-4-1. Pt intubated. MICU at bedside. Orders to follow.

## 2016-09-22 ENCOUNTER — Inpatient Hospital Stay (HOSPITAL_COMMUNITY): Payer: Medicare Other

## 2016-09-22 ENCOUNTER — Inpatient Hospital Stay: Payer: Medicare Other

## 2016-09-22 DIAGNOSIS — R569 Unspecified convulsions: Secondary | ICD-10-CM

## 2016-09-22 DIAGNOSIS — J9811 Atelectasis: Secondary | ICD-10-CM

## 2016-09-22 DIAGNOSIS — R4182 Altered mental status, unspecified: Secondary | ICD-10-CM

## 2016-09-22 LAB — CBC WITH DIFF
BASOPHIL #: 0.05 x10ˆ3/uL (ref 0.00–0.20)
BASOPHIL %: 1 %
EOSINOPHIL #: 0.14 x10ˆ3/uL (ref 0.00–0.50)
EOSINOPHIL %: 2 %
HCT: 33.4 % — ABNORMAL LOW (ref 33.5–45.2)
HGB: 11.4 g/dL (ref 11.2–15.2)
LYMPHOCYTE #: 1.87 x10ˆ3/uL (ref 1.00–4.80)
LYMPHOCYTE %: 22 %
MCH: 32 pg (ref 27.4–33.0)
MCHC: 34.1 g/dL (ref 32.5–35.8)
MCV: 93.9 fL (ref 78.0–100.0)
MONOCYTE #: 1.07 x10ˆ3/uL — ABNORMAL HIGH (ref 0.30–1.00)
MONOCYTE %: 13 %
MPV: 6.9 fL — ABNORMAL LOW (ref 7.5–11.5)
NEUTROPHIL #: 5.22 10*3/uL (ref 1.50–7.70)
NEUTROPHIL #: 5.22 x10ˆ3/uL (ref 1.50–7.70)
NEUTROPHIL %: 63 %
PLATELETS: 240 x10ˆ3/uL (ref 140–450)
RBC: 3.56 x10ˆ6/uL — ABNORMAL LOW (ref 3.63–4.92)
RDW: 16.4 % — ABNORMAL HIGH (ref 12.0–15.0)
WBC: 8.3 x10ˆ3/uL (ref 3.5–11.0)

## 2016-09-22 LAB — BASIC METABOLIC PANEL
ANION GAP: 11 mmol/L (ref 4–13)
BUN/CREA RATIO: 13 (ref 6–22)
BUN: 8 mg/dL (ref 8–25)
CALCIUM: 7.7 mg/dL — ABNORMAL LOW (ref 8.5–10.2)
CHLORIDE: 108 mmol/L (ref 96–111)
CO2 TOTAL: 21 mmol/L — ABNORMAL LOW (ref 22–32)
CREATININE: 0.6 mg/dL (ref 0.49–1.10)
ESTIMATED GFR: >59 mL/min/1.73mˆ2 (ref >59)
GLUCOSE: 75 mg/dL (ref 65–139)
POTASSIUM: 3.6 mmol/L (ref 3.5–5.1)
SODIUM: 140 mmol/L (ref 136–145)

## 2016-09-22 LAB — VENOUS BLOOD GAS/LACTATE
%FIO2 (VENOUS): 30 %
BASE DEFICIT: 3.5 mmol/L — ABNORMAL HIGH (ref ?–3.0)
BICARBONATE (VENOUS): 21.1 mmol/L — ABNORMAL LOW (ref 22.0–26.0)
LACTATE: 1.4 mmol/L — ABNORMAL HIGH (ref 0.0–1.3)
PCO2 (VENOUS): 44 mmHg (ref 41.00–51.00)
PH (VENOUS): 7.32 (ref 7.31–7.41)
PO2 (VENOUS): 32 mmHg — ABNORMAL LOW (ref 35.0–50.0)

## 2016-09-22 LAB — PHOSPHORUS: PHOSPHORUS: 2.8 mg/dL (ref 2.3–4.0)

## 2016-09-22 LAB — MAGNESIUM: MAGNESIUM: 1.8 mg/dL (ref 1.6–2.5)

## 2016-09-22 LAB — VENOUS BLOOD GAS
%FIO2 (VENOUS): 50 %
BASE DEFICIT: 1.5 mmol/L (ref ?–3.0)
BICARBONATE (VENOUS): 22.8 mmol/L (ref 22.0–26.0)
PCO2 (VENOUS): 44 mmHg (ref 41.00–51.00)
PH (VENOUS): 7.35 (ref 7.31–7.41)
PO2 (VENOUS): 35 mmHg (ref 35.0–50.0)

## 2016-09-22 MED ORDER — GADOBENATE DIMEGLUMINE 529 MG/ML(0.1 MMOL/0.2 ML) INTRAVENOUS SOLUTION
18.00 mL | INTRAVENOUS | Status: AC
Start: 2016-09-22 — End: 2016-09-22
  Administered 2016-09-22: 03:00:00 18 mL via INTRAVENOUS

## 2016-09-22 MED ORDER — DIAZEPAM 5 MG TABLET
5.0000 mg | ORAL_TABLET | Freq: Three times a day (TID) | ORAL | Status: DC
Start: 2016-09-22 — End: 2016-09-27
  Administered 2016-09-22 – 2016-09-27 (×15): 5 mg via ORAL
  Filled 2016-09-22 (×16): qty 1

## 2016-09-22 MED ADMIN — midazolam 1 mg/mL in 0.9 % sodium chloride intravenous: INTRAVENOUS | @ 14:00:00

## 2016-09-22 MED ADMIN — SODIUM CHLORIDE 0.9 % W/ ADDITIVES: INTRAVENOUS | @ 11:00:00

## 2016-09-22 MED ADMIN — potassium bicarbonate-citric acid 20 mEq effervescent tablet: @ 20:00:00

## 2016-09-22 MED ADMIN — sodium chloride 0.9 % intravenous solution: GASTROSTOMY | @ 20:00:00 | NDC 00338004904

## 2016-09-22 MED ADMIN — acetaminophen 325 mg tablet: SUBCUTANEOUS | @ 13:00:00

## 2016-09-22 MED ADMIN — artificial tears with lanolin eye ointment: INTRAVENOUS | @ 07:00:00 | NDC 17478006235

## 2016-09-22 NOTE — Care Plan (Signed)
Problem: Patient Care Overview (Adult,OB)  Goal: Plan of Care Review(Adult,OB)  The patient and/or their representative will communicate an understanding of their plan of care   Outcome: Ongoing (see interventions/notes)  Patient extubated this evening to HFNC 50%/ 50 lpm with no respiratory distress noted.  Will continue to monitor and wean as tolerated.

## 2016-09-22 NOTE — Nurses Notes (Signed)
09/22/16 1800   Restraint Type(Q2 Hours)   Restraint Used M   Soft Restraint Right Wrist DISCONTINUED   Soft Restraint Left Wrist DISCONTINUED     Restraints discontinued, no longer indicated for patient safety, patient extubated, will continue to monitor

## 2016-09-22 NOTE — Progress Notes (Signed)
Medical Intensive Care Unit, daily progress note  Patient: [name], 83 y.o. female   MRN:  [id number]  Date: 09/22/2016   LOS: 1 Days    CC/Reason for ICU admission: Altered mental status    Subjective/Overnight events:     NAEO. MRI last night, which had motion artifact. Easily awakened this morning, would squeeze hands when asked but would not follow.     Vitals:  BP (!) 107/49   Pulse 66   Temp 36.4 C (97.5 F)   Resp (!) 10   Wt 90.9 kg (200 lb 6.4 oz)   SpO2 100%    I/O:  Net:    Last 24:   Intake/Output Summary (Last 24 hours) at 09/22/16 0740  Last data filed at 09/22/16 0700   Gross per 24 hour   Intake          1111.94 ml   Output              495 ml   Net           616.94 ml     Ventilator settings:    Conventional settings:  Set VT: 400 mL  Set Rate: 10 Breaths Per Minute  Set PEEP: 5 cmH2O  Pressure Support: 10 cmH2O  PC Set: 16 cmH2O  FiO2: 30 %    Lines/Drains/Foley:   PIVx3  Foley  OG    Inpatient medications:    Reviewed.    Review of Systems: Cannot be completed 2/2 to intubation    Today's physical exam:  Constitutional: appears chronically ill  Eyes: Conjunctiva clear.  ENT: ET securely in place  Neck: no thyromegaly or lymphadenopathy  Respiratory: Clear to auscultation bilaterally.   Cardiovascular: regular rate and rhythm  Gastrointestinal: Soft, non-tender, Bowel sounds normal  Genitourinary: Deferred  Musculoskeletal: Head atraumatic and normocephalic  Integumentary:  Skin warm and dry  Neurologic: Will squeeze hands. Will not follow, unclear if behavioral  Lymphatic/Immunologic/Hematologic: No lymphadenopathy  Psychiatric:     Labs:  I have reviewed all recent lab values and culture results.     Imaging:  I have reviewed all recent imaging studies.      Assessment and Plan:  Patient Active Problem List   Diagnosis    Seizure (HCC)    Altered mental status    Hypophosphatemia    COPD (chronic obstructive pulmonary disease) (HCC)    Schizophrenia (HCC)    HTN (hypertension)     Dementia     [name] is a 83 y.o. female who presents with seizure like activity confirmed by EEG and UTI, intubated due to hypoxia. She is clinically stable. EEG cannot rule out seizure at this time. NCCU following. Spoke to daughter, patient has severe depression and dementia. Consider Psych consult when extubated/out of ICU.     Seizure  -Received load dose of Keppra in ED. Continue 500 mg BID  - Versed drip, continue to wean. D/c fentanyl  -Seizure precautions, neuro checks   -NCCU following  -Routine EEG could not rule out seizure, recommend extended study  -MRI w/wo -read pending    Pneumonia // Hypoxic respiratory failure  -intubation due to poor O2 saturation, likely multifactorial due to seizure-like activity as well  -no leukocytosis, outside facility procal negative   -CXR showed lingual consolidation, possible PNA  -clinically stable  -Vanc/cefepime/flagyl. May have aspirated with seizure  -Procal WNL  -CRP WNL  -urine strep/legionella negative  -blood cultures NGTD  -Flu/biofire negative  -BAL - cell  count and culture - NGTD, PMNS on stain  -Continue to wean vent    UTI  -UA showed moderate leuks, many bacteria. Culture pending  -unclear contribution to AMS  -Clinically stable  -Continue broad spectrum abx as above    Elevated INR  -likely 2/2 to poor nutrition  -Consider Vitamin K replacement     AMS  -hx of dementia/Schizophrenia   -baseline from report is averbal 2/2 to psychiatric disease with contributing dementia  -UTI in this setting may also reduce baseline mentation  -B12/folate/TSH WNL  -urine drug screen showed buprenorphine. May be false  -Consider Psych consult once out of ICU for severe depression vs dementia vs catatonia. May need ECT if patient will not eat    Hypophosphatemia, resolved    Family communication/Prognosis:  Family member updated about patient's current condition and prognosis.    Prognosis guarded.    Prophylaxis:   DVT:   Heparin   GI:  H2  blocker  Analgesia: Fentanyl  Antibiotics: Vanc/cefepime/flagyl  Nutrition: Hold for potential extubation  Therapy: PT/OT  Sedation: Versed/Fentanyl    [name] PikesMark Farrugia, MD  09/22/2016, 07:40      I saw and examined the patient, personally performed the critical or key portions of the service and discussed patient management with the ICU team including the resident, fellow, nurse and ancillary staff. Patient was examined during morning rounds. I reviewed the resident's note and agree with the history, findings and plan of care. Exceptions are edited/noted above. Laboratory, hemodynamic, respiratory support, and radiological data were also reviewed and discussed.     I saw and evaluated the patient. I reviewed the resident's note. I agree with the findings and plan of care as documented in the resident's note. Any exceptions/additions are noted above.   [name], M.D.  Critical care time 35 minutes

## 2016-09-22 NOTE — Care Plan (Signed)
Problem: Patient Care Overview (Adult,OB)  Goal: Plan of Care Review(Adult,OB)  The patient and/or their representative will communicate an understanding of their plan of care  Outcome: Ongoing (see interventions/notes)  Patient remains intubated with current settings of   VENTILATOR - SIMV(PRVC)PS / APV-SIMV CONTINUOUS    Discontinue   Duration: Until Specified    Priority: Routine        Question Answer Comment   FIO2 (%) 30     Peep(cm/H2O) 5     Rate(bpm) 12     Tidal Volume(mls) 400     Pressure Support(cm/H2O) 12     Indications IMPROVE DISTRIBUTION OF VENTILATION            [facility] SIMV(PRVC[facility] overnight, tolerating well. Continue to monitor status

## 2016-09-22 NOTE — Care Plan (Signed)
Problem: Ventilation, Mechanical Invasive (Adult)  Prevent and manage potential problems including: 1. artificial airway induced skin/tissue breakdown 2. gastritis/stress ulcer 3. immobility 4. inability to wean 5. malnutrition 6. mechanical dysfunction 7. situational response 8. ventilator-induced lung injury   Goal: Signs and Symptoms of Listed Potential Problems Will be Absent, Minimized or Managed (Ventilation, Mechanical Invasive)  Signs and symptoms of listed potential problems will be absent, minimized or managed by discharge/transition of care (reference Ventilation, Mechanical Invasive (Adult) CPG).   Outcome: Ongoing (see interventions/notes)    Problem: Non-violent/Non-Self Destructive Restraints  Goal: Alternative methods tried prior to restraints  Outcome: Ongoing (see interventions/notes)  Goal: Patient free from injury and discomfort  Outcome: Ongoing (see interventions/notes)  Goal: Autonomy maintained at the highest possible level  Outcome: Ongoing (see interventions/notes)  Goal: Need for restraints reassessed per policy  Outcome: Ongoing (see interventions/notes)  Goal: Patient education provided  Outcome: Ongoing (see interventions/notes)  Goal: Problem Interventions  Outcome: Ongoing (see interventions/notes)    Problem: Fall Risk (Adult)  Goal: Identify Related Risk Factors and Signs and Symptoms  Related risk factors and signs and symptoms are identified upon initiation of Human Response Clinical Practice Guideline (CPG).   Outcome: Completed Date Met:  09/22/16  Goal: Absence of Falls  Patient will demonstrate the desired outcomes by discharge/transition of care.   Outcome: Ongoing (see interventions/notes)    Problem: Skin Injury Risk (Adult,Obstetrics,Pediatric)  Goal: Identify Related Risk Factors and Signs and Symptoms  Related risk factors and signs and symptoms are identified upon initiation of Human Response Clinical Practice Guideline (CPG).   Outcome: Completed Date Met:  09/22/16   Goal: Skin Health and Integrity  Patient will demonstrate the desired outcomes by discharge/transition of care.   Outcome: Ongoing (see interventions/notes)    Problem: Patient Care Overview (Adult,OB)  Goal: Plan of Care Review(Adult,OB)  The patient and/or their representative will communicate an understanding of their plan of care   Outcome: Ongoing (see interventions/notes)  GCS 4-6-1, versed and fentanyl gtt turned off, plan to extubate this evening, HR NSR, normotensive, skin dry/flaky/intact, foley draining adequate amount of clear amber urine

## 2016-09-22 NOTE — Nurses Notes (Signed)
09/22/16 2000   Aspiration Screening Tool (Not for Day Surgery Patients)   Does the patient have any of the following diagnoses/medical conditions or history of: Other concerns related to swallowing (comment)  (Recent intubation)   Do you need to defer this screening at this time? No   Part A-Pre-Swallow Screening   Is the patient awake and alert, or responding to speech? Yes   Is patient able to be positioned upright, with some head control? Yes   Can the patient cough when asked to? Yes   Is the patient able to lick top and bottom lip? Yes   Is the patient able to breathe freely? Yes   Does the patient have a "WET or HOARSE" sounding voice? No   Part B Swallow Screening   Did patient successfully pass screening? Yes-Patient to be placed on baseline diet per physician's order   Response to First Teaspoon of Water Given No Problems Observed Assessment Continued   Response to Second Teaspoon of Water Given No Problems Observed Assessment Continued   Response to 3 oz of water. Observe for one minute for any s/s aspiration. No Problems Observed. Order should be obtained for patient's baseline diet.     Pt completed bedside aspiration screening, no problems observed. Will notify MICU resident to order diet and PO medications. Will continue to monitor.

## 2016-09-22 NOTE — Procedures (Signed)
[name], [facility]   HOSPITAL NUMBER:  [id number]  DATE OF SERVICE:  09/21/2016  DOB:  1934/05/27  SEX:  F      Date of Study:  September 21, 2016.   Tech:  CT.   EEG #:  [id number].    INTERPRETATION:  This is a normal awake and sleep EEG.    REPORT:  This is a digitally acquired EEG following the standard 10/20 system of electrode placement.  This EEG runs between 10:55 a.m. on September 21, 2016, until 1:31 a.m. on September 22, 2016.  This time period recorded the following.    CLINICAL EVENTS:  There are no pushbutton events recorded.    INTERICTAL ABNORMALITIES:  Throughout the study, there is a normal background frequency over the posterior head regions.  Both sleep and wake states are recorded.    CLINICAL CORRELATION:  The EEG described above is within normal limits.  However, a normal EEG does not rule out the possibility of seizure disorder and clinical correlation is recommended.        Jamse Arnracy Byron Tipping, MD  Assistant Professor   [place] Department of Neurology          DD:  09/22/2016 15:07:37  DT:  09/22/2016 15:25:25 DW  D#:  [id number]

## 2016-09-22 NOTE — Ancillary Notes (Signed)
[facility]  MRI Technologist Note        MRI has been completed.        Janalee DaneLauren L Chalet Kerwin, [facility] 09/22/2016, 02:43

## 2016-09-23 ENCOUNTER — Inpatient Hospital Stay (HOSPITAL_COMMUNITY): Payer: Medicare Other

## 2016-09-23 DIAGNOSIS — I6200 Nontraumatic subdural hemorrhage, unspecified: Secondary | ICD-10-CM

## 2016-09-23 LAB — MAGNESIUM: MAGNESIUM: 1.7 mg/dL (ref 1.6–2.5)

## 2016-09-23 LAB — BASIC METABOLIC PANEL
ANION GAP: 10 mmol/L (ref 4–13)
BUN/CREA RATIO: 11 (ref 6–22)
BUN: 6 mg/dL — ABNORMAL LOW (ref 8–25)
CALCIUM: 8.6 mg/dL (ref 8.5–10.2)
CHLORIDE: 109 mmol/L (ref 96–111)
CO2 TOTAL: 21 mmol/L — ABNORMAL LOW (ref 22–32)
CREATININE: 0.57 mg/dL (ref 0.49–1.10)
ESTIMATED GFR: >59 mL/min/1.73mˆ2 (ref >59)
GLUCOSE: 63 mg/dL — ABNORMAL LOW (ref 65–139)
POTASSIUM: 3.4 mmol/L — ABNORMAL LOW (ref 3.5–5.1)
SODIUM: 140 mmol/L (ref 136–145)

## 2016-09-23 LAB — CBC WITH DIFF
BASOPHIL #: 0.07 x10ˆ3/uL (ref 0.00–0.20)
BASOPHIL %: 1 %
EOSINOPHIL #: 0.29 x10ˆ3/uL (ref 0.00–0.50)
EOSINOPHIL %: 3 %
HCT: 30.7 % — ABNORMAL LOW (ref 33.5–45.2)
HCT: 30.7 % — ABNORMAL LOW (ref 33.5–45.2)
HGB: 10.4 g/dL — ABNORMAL LOW (ref 11.2–15.2)
LYMPHOCYTE #: 1.62 x10ˆ3/uL (ref 1.00–4.80)
LYMPHOCYTE %: 18 %
MCH: 31.3 pg (ref 27.4–33.0)
MCH: 31.3 pg (ref 27.4–33.0)
MCHC: 33.8 g/dL (ref 32.5–35.8)
MCV: 92.6 fL (ref 78.0–100.0)
MONOCYTE #: 0.8 x10ˆ3/uL (ref 0.30–1.00)
MONOCYTE %: 9 %
MPV: 6.8 fL — ABNORMAL LOW (ref 7.5–11.5)
NEUTROPHIL #: 6.38 x10ˆ3/uL (ref 1.50–7.70)
NEUTROPHIL %: 70 %
PLATELETS: 244 10*3/uL (ref 140–450)
PLATELETS: 244 x10ˆ3/uL (ref 140–450)
RBC: 3.31 x10ˆ6/uL — ABNORMAL LOW (ref 3.63–4.92)
RDW: 16 % — ABNORMAL HIGH (ref 12.0–15.0)
WBC: 9.2 x10ˆ3/uL (ref 3.5–11.0)

## 2016-09-23 LAB — URINE CULTURE
URINE CULTURE: >100000 — AB
URINE CULTURE: >100000 — AB

## 2016-09-23 LAB — VENOUS BLOOD GAS/LACTATE
%FIO2 (VENOUS): 40 %
BASE DEFICIT: 2.3 mmol/L (ref ?–3.0)
BICARBONATE (VENOUS): 23.1 mmol/L (ref 22.0–26.0)
LACTATE: 0.7 mmol/L (ref 0.0–1.3)
PCO2 (VENOUS): 38 mmHg — ABNORMAL LOW (ref 41.00–51.00)
PH (VENOUS): 7.38 (ref 7.31–7.41)
PO2 (VENOUS): 94 mmHg — ABNORMAL HIGH (ref 35.0–50.0)

## 2016-09-23 LAB — C-REACTIVE PROTEIN(CRP),INFLAMMATION: CRP INFLAMMATION: 152.5 mg/L — ABNORMAL HIGH (ref <=8.0)

## 2016-09-23 LAB — POC BLOOD GLUCOSE (RESULTS): GLUCOSE, POC: 75 mg/dL (ref 70–105)

## 2016-09-23 LAB — PHOSPHORUS: PHOSPHORUS: 2.4 mg/dL (ref 2.3–4.0)

## 2016-09-23 MED ORDER — POLYETHYLENE GLYCOL 3350 17 GRAM ORAL POWDER PACKET
17.0000 g | Freq: Every day | ORAL | Status: DC
Start: 2016-09-23 — End: 2016-09-27
  Administered 2016-09-23: 0 g via ORAL
  Administered 2016-09-24 – 2016-09-25 (×2): 17 g via ORAL
  Administered 2016-09-26: 0 g via ORAL
  Administered 2016-09-27: 17 g via ORAL
  Filled 2016-09-23 (×5): qty 1

## 2016-09-23 MED ORDER — SENNA LEAF EXTRACT 176 MG/5 ML ORAL SYRUP
10.00 mL | ORAL_SOLUTION | Freq: Two times a day (BID) | ORAL | Status: DC
Start: 2016-09-23 — End: 2016-09-27
  Administered 2016-09-23 (×2): 0 mg via ORAL
  Administered 2016-09-24 – 2016-09-25 (×3): 352 mg via ORAL
  Administered 2016-09-25 – 2016-09-26 (×2): 0 mg via ORAL
  Administered 2016-09-27: 352 mg via ORAL
  Filled 2016-09-23 (×10): qty 15

## 2016-09-23 MED ORDER — LEVETIRACETAM 500 MG TABLET
500.00 mg | ORAL_TABLET | Freq: Two times a day (BID) | ORAL | Status: DC
Start: 2016-09-23 — End: 2016-09-27
  Administered 2016-09-23 – 2016-09-27 (×9): 500 mg via ORAL
  Filled 2016-09-23 (×10): qty 1

## 2016-09-23 MED ORDER — FAMOTIDINE 40 MG/5 ML (8 MG/ML) ORAL SUSPENSION
20.0000 mg | INHALATION_SUSPENSION | Freq: Every day | ORAL | Status: DC
Start: 2016-09-23 — End: 2016-09-27
  Administered 2016-09-23 (×2): 0 mg via ORAL
  Administered 2016-09-24 – 2016-09-27 (×4): 20 mg via ORAL
  Filled 2016-09-23 (×5): qty 2.5

## 2016-09-23 MED ORDER — MEROPENEM 1 GRAM IN 20 ML SW SOLUTION FOR INJECTION - IV PUSH
1.0000 g | Freq: Three times a day (TID) | INTRAVENOUS | Status: DC
Start: 2016-09-23 — End: 2016-09-25
  Administered 2016-09-23 – 2016-09-25 (×8): 1 g via INTRAVENOUS
  Filled 2016-09-23 (×9): qty 20

## 2016-09-23 MED ADMIN — heparin (porcine) 5,000 unit/mL injection solution: SUBCUTANEOUS | @ 21:00:00

## 2016-09-23 MED ADMIN — vancomycin 1 gram/200 mL in dextrose 5 % intravenous piggyback: INTRAVENOUS | @ 08:00:00

## 2016-09-23 MED ADMIN — electrolyte-A intravenous solution: @ 14:00:00 | NDC 00338022104

## 2016-09-23 NOTE — Nurses Notes (Signed)
Patient had 2.3 second pause, per telemetry. Patient resting comfortable in bed. Notified [name].

## 2016-09-23 NOTE — Consults (Signed)
[facility]  Neurosurgery Consult  Initial Consult Note      [name], [name], 83 y.o. female  Date of Admission:  09/21/2016  Date of Service: 09/23/2016  Date of Birth:  12-27-1933    Primary Service: Med Hosp 5  Requesting Faculty: Ronalee Red  Reason for Consult: L occipital subdural empyema vs hematoma      Information Obtained from: patient, health care provider and history reviewed via medical record  Chief Complaint: AMS, seizure activity    HPI:  [name] is a 83 y.o., White female with PMH of COPD, Dementia, schizophrenia, HTN who presents with seizures found to have L occipital subdural empyema vs hematoma. HD3    Unclear if nonverbal at baseline. Possible dementia at baseline. Resides in nursing home.    Per Neurology consultation note: "Patient initially brought to outside facility after she was thought to be having seizures at her nursing facility. On arrival, patient was hypoxic, unresponsive, and consequently intubated. There was concern from outside facility that she may be having a stroke however CT stroke was negative. Labs most notable for a lactate of 10. She was started on Levaquin for possible UTI and transferred to [facility]. ... Baseline was able to walk, talk, dress and feed herself until son passed away in 15-May-2023 and she stopped talking, walking and was able to take care of herself, she also attempted suicide after the death of her son."    On exam she IS verbal and answers some questions.  States she has not been able to move BLE since residing in nursing home.  Infectious workup demonstrates possible PNA and E coli UTI.     ROS: (MUST comment on all "Abnormal" findings)  Review of systems was not obtained due to AMS .    PAST MEDICAL/ FAMILY/ SOCIAL HISTORY:   Past Medical History:   Diagnosis Date   . COPD (chronic obstructive pulmonary disease) ([place])    . Dementia    . HTN (hypertension)    . Schizophrenia ([place])          Medications Prior to Admission     Prescriptions    ciprofloxacin HCl  (CIPRO) 500 mg Oral Tablet    Take 500 mg by mouth Twice daily    levoFLOXacin (LEVAQUIN) 500 mg Oral Tablet    Take 500 mg by mouth Once a day    pantoprazole (PROTONIX) 20 mg Oral Tablet, Delayed Release (E.C.)    Take 20 mg by mouth Every morning before breakfast           Current Facility-Administered Medications:  diazePAM (VALIUM) tablet 5 mg Oral Q8H   famotidine (PEPCID) 13m per 563moral liquid 20 mg Oral Daily   heparin 5,000 unit/mL injection 5,000 Units Subcutaneous Q8HRS   levETIRAcetam (KEPPRA) tablet 500 mg Oral 2x/day   meropenem (MERREM) IV injection 1 g 1 g Intravenous Q8H   NS flush syringe 2 mL Intracatheter Q8HRS   And      NS flush syringe 2-6 mL Intracatheter Q1 MIN PRN   polyethylene glycol (MIRALAX) oral packet 17 g Oral Daily   senna concentrate (SENNA) 52850mer 74m1mal liquid 10 mL Oral 2x/day   vancomycin (VANCOCIN) 1 g in D5W 200 mL premix IVPB 1,000 mg Intravenous Q24H   Vancomycin IV - Pharmacist to Dose per Protocol  Does not apply Daily PRN     Allergies   Allergen Reactions   . Sulfa (Sulfonamides)    . Bactrim [Sulfamethoxazole-Trimethoprim] Swelling   .  Penicillins Swelling       Social History   Substance Use Topics   . Smoking status: None   . Smokeless tobacco: None   . Alcohol use None     Family Medical History     None              PHYSICAL EXAMINATION: (MUST comment on all "Abnormal" findings)  Constitutional  Temperature: 37.1 C (98.8 F)  Heart Rate: 81  BP (Non-Invasive): (!) 147/94  Respiratory Rate: (!) 24  SpO2-1: 100 %  Pain Score (Numeric, Faces): 0    Appears stated age, chronically ill, NAD  GCS _0 Fluent speech  Decreased fund of knowledge  Decreased attention span & concentration  Decreased recent and remote memory  CN 2 PERRL  CN _1 EOMI  CN 7 Face symmetric  CN 8 Hearing grossly intact  CN 12 Tongue midline  FC BUE, at least antigravity but weak  WD BLE, no movement to command  Muscle tone WNL  SILT BUE and BLE  UTA drift  No hoffman  No clonus     Labs Ordered/ Reviewed : (Please indicate ordered or reviewed)  Reviewed: Labs:  BMP:     Recent Labs      09/23/16   0411   SODIUM  140   POTASSIUM  3.4*   CHLORIDE  109   CO2  21*   BUN  6*   CREATININE  0.57   GLUCOSENF  63*   ANIONGAP  10   BUNCRRATIO  11   GFR  >59   CALCIUM  8.6     CBC Results Differential Results   Recent Labs      09/23/16   0411   WBC  9.2   HGB  10.4*   HCT  30.7*   PLTCNT  244    Recent Results (from the past 30 hour(s))   CBC WITH DIFF    Collection Time: 09/23/16  4:11 AM   Result Value    WBC 9.2    NEUTROPHIL % 70    LYMPHOCYTE % 18    MONOCYTE % 9    EOSINOPHIL % 3    BASOPHIL % 1    BASOPHIL # 0.07        ESR(automated)/C-protein:   Recent Labs      09/23/16   1740   CREAPROINFLA  152.5*     Coags:  No results found for this encounter      Radiology Tests Ordered/ Reviewed: (Please indicate ordered or reviewed)  MRI brain w/wo: Small left occipital convexity subdural fluid collection suspicious for a empyema and less likely hematoma given the blood sensitive sequence findings and restricted diffusion  CT brain w/o: small left occipital collection grossly unchanged and not hyperdense    Current Comorbid Conditions - Neurosurgery Consult  Loss of Consciousness: yes  Cerebral Edema: no  Cerebral Hemorrhage: Not applicable  Brain Compression:no  Craniectomy: no  Obstructive Hydrocephalus: no  Coma less than 8: Coma -Not applicable  Seizure: Seizure-Unspecified  Respiratory Failure: Cardiorespiratory Failure  Vent on admission 09/21/16(date)  Coagulopathy: Not applicable      Impression/Recommendations  [name] is a 83 y.o., White female with PMH of COPD, Dementia, schizophrenia, HTN who presents with seizures found to have small L occipital subdural empyema vs hematoma less likely. HD3  -- No acute neurosurgical intervention  -- Keppra 500 mg BID per neurology; no seizures at this time  --  Currently on Hep TID, meropenem, vancomycin  -- Recommend ID consultation  -- OOB as tolerated,  PT/OT, no restrictions from NSGY standpoint    Will discus with staff although likely just needs abx treatment.    Casimer Lanius) Orie Fisherman, MD  Sharolyn Douglas MD  PGY-4 Neurosurgery  09/23/2016      ADDENDUM:  Discussed with Dr. Bill Salinas.  Plan as above + recommend MRI LS spine wo contrast and EMG given no movement in LE.    Sharolyn Douglas MD  PGY-4 Neurosurgery  09/24/2016         LE sensory/motor deficits need workup  I saw and examined the patient.  I reviewed the resident's note.  I agree with the findings and plan of care as documented in the resident's note.  Any exceptions/additions are edited/noted.    Leeroy Cha, MD

## 2016-09-23 NOTE — Care Plan (Signed)
Problem: Patient Care Overview (Adult,OB)  Goal: Plan of Care Review(Adult,OB)  The patient and/or their representative will communicate an understanding of their plan of care   Outcome: Ongoing (see interventions/notes)  Patient currently on a nasal cannula at 2 lpm, will continue to wean for adequate O2 saturations as tolerated.

## 2016-09-23 NOTE — Respiratory Therapy (Signed)
Patient switched from HFNC to NC. Maintaining adequate saturations and resting comfortably. Continue to monitor status

## 2016-09-23 NOTE — Nurses Notes (Signed)
Serum blood glucose 63 on AM labs. Pt given 8oz apple juice. MICU resident (Dhilon) notified, will recheck POC glucose @0630 .

## 2016-09-23 NOTE — Nurses Notes (Signed)
Patient transferred to room 768. No complaints at this time. 98% on 2 L NC. See doc flowsheet for full assessment.

## 2016-09-23 NOTE — Nurses Notes (Signed)
Pt transferred to 7E via bed with RN escort on monitor.

## 2016-09-23 NOTE — Progress Notes (Signed)
Medicine Transfer Acceptance Note    I have reviewed the plan as outlined below as per the ICU team and accept the patient on the medicine hospitalist 5 service. I have evaluated her at bedside today.    [name] Hatchnn Scottis a 83 y.o.femalewho presents with seizure like activity confirmed by EEG and UTI, intubated due to hypoxia. She is clinically stable. EEG cannot rule out seizure at this time. NCCU following. Spoke to daughter, patient has severe depression and dementia. Consider Psych consult when extubated/out of ICU.     Seizure  -Received load dose of Keppra in ED. Continue 500 mg BID  -Seizure precautions, neuro checks   -NCCU following  -Routine EEG could not rule out seizure, extended study  -MRI w/wo -read pending  - Neurology to continue to follow    Pneumonia // Hypoxic respiratory failure  - On 4LPM NC was extubated today  -no leukocytosis, outside facility procal negative   -CXR showed lingual consolidation, possible PNA  -clinically stable  -Vanc/Meropenem May have aspirated with seizure  -Procal WNL  -CRP WNL  -urine strep/legionella negative  -blood cultures NGTD  -Flu/biofire negative  -BAL - cell count and culture - NGTD, PMNS on stain  -Continue to wean vent    UTI  -UA showed moderate leuks, many bacteria. Culture pending  -unclear contribution to AMS  -Clinically stable  -Continue broad spectrum abx as above    Elevated INR  -likely 2/2 to poor nutrition  -Consider Vitamin K replacement     AMS  -hx of dementia/Schizophrenia   -baseline from report is averbal 2/2 to psychiatric disease with contributing dementia  -UTI in this setting may also reduce baseline mentation  -B12/folate/TSH WNL  -urine drug screen showed buprenorphine. May be false    Hypophosphatemia, resolved    [name] BasqueEric Tzu-Wei [name], MD 09/23/2016 11:03  Hospitalist  Department of Internal Medicine  [facility] [employer]

## 2016-09-24 ENCOUNTER — Encounter (HOSPITAL_COMMUNITY): Payer: Self-pay

## 2016-09-24 DIAGNOSIS — E876 Hypokalemia: Secondary | ICD-10-CM | POA: Insufficient documentation

## 2016-09-24 DIAGNOSIS — R410 Disorientation, unspecified: Secondary | ICD-10-CM

## 2016-09-24 DIAGNOSIS — I4519 Other right bundle-branch block: Secondary | ICD-10-CM

## 2016-09-24 DIAGNOSIS — I444 Left anterior fascicular block: Secondary | ICD-10-CM

## 2016-09-24 DIAGNOSIS — R531 Weakness: Secondary | ICD-10-CM

## 2016-09-24 DIAGNOSIS — G062 Extradural and subdural abscess, unspecified: Secondary | ICD-10-CM

## 2016-09-24 LAB — CBC WITH DIFF
BASOPHIL #: 0.06 x10ˆ3/uL (ref 0.00–0.20)
BASOPHIL %: 1 %
EOSINOPHIL #: 0.25 x10?3/uL (ref 0.00–0.50)
EOSINOPHIL #: 0.25 x10ˆ3/uL (ref 0.00–0.50)
EOSINOPHIL %: 3 %
HCT: 33 % — ABNORMAL LOW (ref 33.5–45.2)
HGB: 10.9 g/dL — ABNORMAL LOW (ref 11.2–15.2)
LYMPHOCYTE #: 1.85 x10ˆ3/uL (ref 1.00–4.80)
LYMPHOCYTE %: 24 %
MCH: 30.9 pg (ref 27.4–33.0)
MCHC: 33.1 g/dL (ref 32.5–35.8)
MCV: 93.6 fL (ref 78.0–100.0)
MONOCYTE #: 0.88 x10ˆ3/uL (ref 0.30–1.00)
MONOCYTE %: 11 %
MPV: 7.2 fL — ABNORMAL LOW (ref 7.5–11.5)
NEUTROPHIL #: 4.79 x10ˆ3/uL (ref 1.50–7.70)
NEUTROPHIL %: 61 %
PLATELETS: 250 x10ˆ3/uL (ref 140–450)
RBC: 3.53 x10ˆ6/uL — ABNORMAL LOW (ref 3.63–4.92)
RDW: 15.7 % — ABNORMAL HIGH (ref 12.0–15.0)
WBC: 7.8 x10ˆ3/uL (ref 3.5–11.0)

## 2016-09-24 LAB — BASIC METABOLIC PANEL
ANION GAP: 8 mmol/L (ref 4–13)
BUN/CREA RATIO: 4 — ABNORMAL LOW (ref 6–22)
BUN: 2 mg/dL — ABNORMAL LOW (ref 8–25)
CALCIUM: 8.7 mg/dL (ref 8.5–10.2)
CHLORIDE: 108 mmol/L (ref 96–111)
CO2 TOTAL: 26 mmol/L (ref 22–32)
CREATININE: 0.53 mg/dL (ref 0.49–1.10)
ESTIMATED GFR: >59 mL/min/1.73mˆ2 (ref >59)
GLUCOSE: 98 mg/dL (ref 65–139)
POTASSIUM: 3 mmol/L — ABNORMAL LOW (ref 3.5–5.1)
SODIUM: 142 mmol/L (ref 136–145)

## 2016-09-24 LAB — ECG 12-LEAD
Atrial Rate: 357 {beats}/min
Atrial Rate: 56 {beats}/min
Calculated P Axis: 80 degrees
Calculated R Axis: -65 degrees
Calculated R Axis: -65 degrees
Calculated T Axis: -12 degrees
Calculated T Axis: -16 degrees
PR Interval: 232 ms
QRS Duration: 86 ms
QRS Duration: 104 ms
QT Interval: 406 ms
QT Interval: 460 ms
QTC Calculation: 468 ms
QTC Calculation: 443 ms
Ventricular rate: 80 {beats}/min
Ventricular rate: 56 {beats}/min

## 2016-09-24 LAB — BRONCHOALVEOLAR LAVAGE (BAL) QUANTITATIVE CULTURE/GRAM STAIN
BRONCHOALVEOLAR LAVAGE (BAL) QUANTITATIVE CULTURE: NO GROWTH
GRAM STAIN: NONE SEEN

## 2016-09-24 LAB — VANCOMYCIN, TROUGH: VANCOMYCIN TROUGH: 10.1 ug/mL (ref 10.0–20.0)

## 2016-09-24 LAB — TROPONIN-I: TROPONIN I: 9 ng/L (ref 0–30)

## 2016-09-24 LAB — PHOSPHORUS: PHOSPHORUS: 1.9 mg/dL — ABNORMAL LOW (ref 2.3–4.0)

## 2016-09-24 LAB — MAGNESIUM: MAGNESIUM: 1.5 mg/dL — ABNORMAL LOW (ref 1.6–2.5)

## 2016-09-24 MED ORDER — MAGNESIUM SULFATE 2 GRAM/50 ML (4 %) IN WATER INTRAVENOUS PIGGYBACK
2.0000 g | INJECTION | Freq: Once | INTRAVENOUS | Status: AC
Start: 2016-09-24 — End: 2016-09-24
  Administered 2016-09-24: 2 g via INTRAVENOUS
  Administered 2016-09-24: 0 g via INTRAVENOUS
  Filled 2016-09-24 (×2): qty 50

## 2016-09-24 MED ORDER — LANOLIN-OXYQUIN-PET, HYDROPHIL TOPICAL OINTMENT
TOPICAL_OINTMENT | Freq: Two times a day (BID) | CUTANEOUS | Status: DC | PRN
Start: 2016-09-24 — End: 2016-09-27
  Filled 2016-09-24: qty 1

## 2016-09-24 MED ORDER — POTASSIUM CHLORIDE ER 20 MEQ TABLET,EXTENDED RELEASE(PART/CRYST)
40.0000 meq | ORAL_TABLET | ORAL | Status: AC
Start: 2016-09-24 — End: 2016-09-24
  Administered 2016-09-24 (×2): 40 meq via ORAL
  Filled 2016-09-24 (×2): qty 2

## 2016-09-24 MED ADMIN — PANTOPRAZOLE 80MG IN NS 100ML CONTINUOUS INFUSION: ORAL | @ 08:00:00 | NDC 00008092355

## 2016-09-24 MED ADMIN — famotidine 40 mg/5 mL (8 mg/mL) oral suspension: ORAL | @ 08:00:00

## 2016-09-24 MED ADMIN — heparin (porcine) 5,000 unit/mL injection solution: SUBCUTANEOUS | @ 21:00:00

## 2016-09-24 MED ADMIN — ioversoL 350 mg iodine/mL intravenous solution: ORAL | @ 21:00:00

## 2016-09-24 MED ADMIN — triamcinolone acetonide 0.1 % topical cream: @ 17:00:00 | NDC 00168000415

## 2016-09-24 NOTE — Care Management Notes (Addendum)
[facility]  Care Management Initial Evaluation    Patient Name: Cleatrice Burkenn Bieker  Date of Birth: 06/19/1934  Sex: female  Date/Time of Admission: 09/21/2016  5:21 AM  Room/Bed: 768/A  Payor: MEDICARE / Plan: MEDICARE PART A AND B / Product Type: Medicare /   PCP: Pcp Not In System    Pharmacy Info:   Preferred Pharmacy     None        Emergency Contact Info:   Extended Emergency Contact Information  Primary Emergency Contact: Wende CreaseJoyce Castelluccio   [place]  Mobile Phone: [phone]  Relation: Daughter  Secondary Emergency Contact: Hassan RowanHANSKINS, STANLEY   [place]  Home Phone: [phone]  Work Phone: [phone]  Mobile Phone: [phone]  Relation: Brother    History:   Cleatrice Burkenn Cirillo is a 83 y.o., female, admitted with AMS.    Height/Weight: 170 cm (5' 6.93") / 88.6 kg (195 lb 5.2 oz)     LOS: 3 days   Admitting Diagnosis: Altered mental status [R41.82]    Assessment:      09/24/16 1401   Assessment Details   Assessment Type Admission   Date of Care Management Update 09/24/16   Date of Next DCP Update 09/27/16   Readmission   Is this a readmission? No   Care Management Plan   Discharge Planning Status initial meeting   Projected Discharge Date 09/27/16   CM will evaluate for rehabilitation potential no   Patient choice offered to patient/family yes   Form for patient choice reviewed/signed and on chart yes   Fac[facility] or [facility]   Patient aware of possible cost for ambulance transport?  No   Discharge Needs Assessment   Discharge Facility/Level Of Care Needs SNF Return (Medicare certified)(code 3)   Transportation Available ambulance   Referral Information   Admission Type inpatient   Address Verified verified-no changes   Arrived From skilled nursing facility   Skilled Nursing Facility [facility]   ADVANCE DIRECTIVES   Does the Patient have an Advance Directive? Yes, Patient Does Have Advance Directive for Healthcare Treatment   Type of Advance Directive  Completed Medical Power of Attorney   Copy of Advance Directives in Chart? 2   Name of MPOA or Healthcare Surrogate Melvenia NeedlesJoyce Gilmore-daughter   Phone Number of MPOA or Healthcare Surrogate [phone]   Patient Requests Assistance in Having Advance Directive Notarized. N/A   LAY CAREGIVER   Appointed Lay Caregiver? I Decline   Employment/Financial   Patient has Prescription Coverage?  Yes   Living Environment   Living Arrangements *nursing home   Able to Return to Prior Arrangements yes    Cleatrice Burkenn Laverne is a 83 y.o., [place] who presents with a seizure like episode.    Discharge Plan:  SNF Return (Medicare certified) (code 3)  Patient [facility] [place]. MSW spoke with patients daughter Harriette BouillonJoyce Nadeem. She states that she is MPOA and SNF should have a copy of documents. MSW requesting documents from SNF.   SNF faxed MPOA documents. Patients daughter Harriette BouillonJoyce Tamburro is MPOA. Documents placed on chart. MD and RN updated.    The patient will continue to be evaluated for developing discharge needs.     Case Manager: Heber Carolinaaniel Michail Boyte, LGSW  Phone: [phone]

## 2016-09-24 NOTE — Progress Notes (Signed)
[facility]  Med[facility]    [name] BurkeAnn [name]  Date of service: 09/24/2016  Date of Admission:  09/21/2016    Hospital Day:  LOS: 3 days   Subjective: Patient is talking in short sentences. Not complaining of any pain at this time. Breathing is OK. She appears to be more interactive today than she was yesterday.    Vital Signs:  Temp  Avg: 36.8 C (98.2 F)  Min: 36.3 C (97.4 F)  Max: 37.3 C (99.1 F)    Pulse  Avg: 77.1  Min: 61  Max: 89 BP  Min: 95/71  Max: 152/77   Resp  Avg: 21.3  Min: 15  Max: 29 SpO2  Avg: 98.3 %  Min: 89 %  Max: 100 %   Pain Score (Numeric, Faces): 0      Input/Output    Intake/Output Summary (Last 24 hours) at 09/24/16 0719  Last data filed at 09/24/16 0200   Gross per 24 hour   Intake              380 ml   Output              415 ml   Net              -35 ml    I/O last shift:  01/15 0000 - 01/15 0759  In: 60 [P.O.:60]  Out: -      Current Facility-Administered Medications:  diazePAM (VALIUM) tablet 5 mg Oral Q8H   famotidine (PEPCID) 40mg  per 5mL oral liquid 20 mg Oral Daily   heparin 5,000 unit/mL injection 5,000 Units Subcutaneous Q8HRS   levETIRAcetam (KEPPRA) tablet 500 mg Oral 2x/day   magnesium sulfate 2 G in SW 50 mL premix IVPB 2 g Intravenous Once   meropenem (MERREM) IV injection 1 g 1 g Intravenous Q8H   NS flush syringe 2 mL Intracatheter Q8HRS   And      NS flush syringe 2-6 mL Intracatheter Q1 MIN PRN   polyethylene glycol (MIRALAX) oral packet 17 g Oral Daily   potassium chloride (K-DUR) extended release tablet 40 mEq Oral Q2H   senna concentrate (SENNA) 528mg  per 15mL oral liquid 10 mL Oral 2x/day   vancomycin (VANCOCIN) 1 g in D5W 200 mL premix IVPB 1,000 mg Intravenous Q24H   Vancomycin IV - Pharmacist to Dose per Protocol  Does not apply Daily PRN     Physical Exam:   General:Cooperative female in no acute distress.   Lungs: Clear to auscultation bilaterally. No crackles, rales or wheezing   Cardiovascular: regular rate and rhythm, S1, S2 normal, no  murmur, click, rub or gallop   Abdomen: Soft, non-tender, Bowel sounds normal, non-distended, No hepatosplenomegaly   Extremities: No cyanosis or edema  Neurologic: Alert and oriented x3        Labs:    CBC:  Recent Labs      09/22/16   0417  09/23/16   0411  09/24/16   0413   WBC  8.3  9.2  7.8   HGB  11.4  10.4*  10.9*   HCT  33.4*  30.7*  33.0*   PLTCNT  240  244  250    RBC Indices:  Recent Labs      09/22/16   0417  09/23/16   0411  09/24/16   0413   MCV  93.9  92.6  93.6   MCH  32.0  31.3  30.9   MCHC  34.1  33.8  33.1   RDW  16.4*  16.0*  15.7*   MPV  6.9*  6.8*  7.2*      Differential:   Recent Labs      09/22/16   0417  09/23/16   0411  09/24/16   0413   PMNS  63  70  61   LYMPHOCYTES  22  18  24    MONOCYTES  13  9  11    EOSINOPHIL  2  3  3    BASOPHILS  1  0.05  1  0.07  1  0.06   PMNABS  5.22  6.38  4.79   LYMPHSABS  1.87  1.62  1.85   MONOSABS  1.07*  0.80  0.88   EOSABS  0.14  0.29  0.25     BMP:  Recent Labs      09/22/16   0417  09/23/16   0411  09/24/16   0413   SODIUM  140  140  142   POTASSIUM  3.6  3.4*  3.0*   CHLORIDE  108  109  108   CO2  21*  21*  26   BUN  8  6*  2*   CREATININE  0.60  0.57  0.53   GFR  >59  >59  >59   ANIONGAP  11  10  8    GLUCOSENF  75  63*  98   CALCIUM  7.7*  8.6  8.7   MAGNESIUM  1.8  1.7  1.5*   PHOSPHORUS  2.8  2.4  1.9*     Liver/Pancreas:  No results for input(s): TOTALPROTEIN, ALBUMIN, PREALBUMIN, AST, ALT, ALKPHOS, LDH, AMYLASE, LIPASE, TOTBILIRUBIN, BILIRUBINCON in the last 72 hours.    Invalid input(s): GGT  Cardiac Markers:  Recent Labs      09/24/16   0413   TROPONINI  9     Coagulation Studies:  No results for input(s): INR, PROTHROMTME, APTT in the last 72 hours.    Invalid input(s): PTT, PT, CREACTPROTIE  ABG's  No results found for this encounter    Results for orders placed or performed during the hospital encounter of 09/21/16 (from the past 72 hour(s))   XR AP MOBILE CHEST     Status: None    Narrative    [name]  Female, 83 years old.    XR AP  MOBILE CHEST performed on 09/21/2016 11:36 AM.    REASON FOR EXAM:  OG    TECHNIQUE: 1 views/2 images submitted for interpretation.    COMPARISON: September 21, 2016 at 0537 hours    FINDINGS  : There is a shallow inspiration.    LUNGS:  Clear.    HEART AND VASCULATURE:  The heart is mildly enlarged and appears unchanged.  No vascular congestion or interstitial edema is visible.    PLEURA:  No pleural effusion or pneumothorax.    SUPPORT DEVICES:  Endotracheal tube ends 3.5 cm above the level of the  carina.  Enteric tube in place with tip in the area of the body of the stomach.      Impression    1. Placement of enteric tube with tip in the area of the body of the  stomach.  2. Shallow inspiration.  3. Mild cardiac enlargement, no evidence of pulmonary edema.     MRI BRAIN W/WO CONTRAST     Status: None    Narrative    [name]  Female, 82  years old.    MRI BRAIN W/WO CONTRAST performed on 09/22/2016 2:43 AM.    REASON FOR EXAM:  Hx of seizure like episode - eval from intracranial  process      COMPARISON: None    TECHNIQUE: MR imaging of the brain was performed before and after 18 ml's  of Multihance      FINDINGS: There is no abnormal restricted diffusion to suggest acute  infarct. However, there is abnormal diffusion restriction involving a small  collection of subdural fluid versus blood along the left occipital  convexity. This is hyperintense on T1 and T2 which would reflect a late  subacute hematoma, however, there is no signal dropout within this fluid to  suggest blood and subsequently this is more concerning for a subdural  empyema. There is a small amount of adjacent subarachnoid hemorrhage.  Motion artifact limits detailed evaluation on postcontrast imaging. No  other definite mass lesions are identified. There is generalized brain  parenchymal volume loss. Thin section imaging through the temporal lobes  are unremarkable.        Impression    Small left occipital convexity subdural fluid collection  suspicious for a  empyema and less likely hematoma given the blood sensitive sequence  findings and restricted diffusion.    This was discussed with [name], APP, of the neuro critical care  unit.     XR AP MOBILE CHEST     Status: None    Narrative    XR AP MOBILE CHEST performed on 09/22/2016 8:07 AM    INDICATION: 83 years old Female  Hx of seizures and AMS s/p intubation - eval ET placement and acute lung  process    TECHNIQUE: 1 views of the chest; 1 images    COMPARISON: Chest radiograph dated September 21, 2016    FINDINGS: Minimal atelectasis is noted at the medial aspect of the left  lung base.  No pleural effusion or pneumothorax is visible.  The heart   size  is at the upper limits of normal.  The pulmonary vasculature is  unremarkable.    Endotracheal tube projects 4 cm above the carina.  Enteric tube sidehole projects over the stomach.        Impression    Minimal left lung base atelectasis     CT BRAIN WO IV CONTRAST     Status: None (Preliminary result)    Narrative    [name]  Female, 83 years old.    CT BRAIN WO IV CONTRAST performed on 09/23/2016 8:33 PM.    REASON FOR EXAM:  subdural hematoma vs empyema      TECHNIQUE: 5mm axial images of the brain from the vertex to the skull base  with reformatted coronal and sagittal images were obtained and reviewed in  bone, soft tissue, and stroke windows.     COMPARISON: MRI brain dated September 22, 2016.    FINDINGS: A small left occipital convexity subdural fluid collection is  better visualized on prior MRI. No new extra-axial fluid collection or  intracranial hemorrhage is identified. No mass effect, midline shift, or  hydrocephalus is present. A focal area of encephalomalacia in the superior  right cerebellum is consistent with a remote infarct. Gray-white interface  is otherwise preserved. Generalized volume loss is noted in the form of  prominent ventricles and sulci. The skull and scalp are within normal  limits. Trace secretions are present  within the left frontal sinus. The  visualized remaining paranasal sinuses are  well-aerated. Basal ganglia  calcifications are present bilaterally.      Impression    Small left occipital convexity subdural fluid collection is better  visualized on MRI and suspicious for empyema. No new intracranial  abnormality.           PT/OT: Yes    Consults: NSGY, neurology, Cardiology (to be called)      Assessment/ Plan:   Active Hospital Problems    Diagnosis   . Primary Problem: Altered mental status   . Seizure (HCC)   . Hypophosphatemia   . Dementia     [name] Scottis a 83 y.o.femalewho presents with seizure like activity confirmed by EEG and UTI, intubated due to hypoxia. She is clinically stable. EEG cannot rule out seizure at this time. NCCU following. Spoke to daughter, patient has severe depression and dementia. Consider Psych consult when extubated/out of ICU.     Seizure  -Received load dose of Keppra in ED. Continue 500 mg BID  -Seizure precautions, neuro checks   -NCCU following  -Routine EEG could not rule out seizure, extended study  -MRI w/wo -read pending  - Neurology to continue to follow    Possible Subdural Empyema  - NSGY consultation, Neurology consultation  - Following recs  - Also obtaining MRI L spine and EMG for lower extremity weakness    Pneumonia // Hypoxic respiratory failure  - On 4LPM NC was extubated today  -no leukocytosis, outside facility procal negative  -CXR showed lingual consolidation, possible PNA  -clinically stable  -Vanc/Meropenem May have aspirated with seizure  -Procal WNL  -CRP WNL  -urine strep/legionella negative  -blood cultures NGTD  -Flu/biofire negative  -BAL - cell count and culture - NGTD, PMNS on stain  -Continue to wean vent    UTI  -UA showed moderate leuks, many bacteria. Culture pending  -unclear contribution to AMS  -Clinically stable  -Continue broad spectrum abx as above    Elevated INR  -likely 2/2 to poor nutrition  -Consider Vitamin K replacement      AMS  -hx of dementia/Schizophrenia   -baseline from report is averbal 2/2 to psychiatric disease with contributing dementia  -UTI in this setting may also reduce baseline mentation  -B12/folate/TSH WNL  -urine drug screen showed buprenorphine. May be false    Hypophosphatemia, resolved    [name] Hanks, MD

## 2016-09-24 NOTE — Consults (Signed)
PATIENT NAME: [name] NUMBER:  [id number]  DATE OF SERVICE: 09/24/2016  DATE OF BIRTH:  [birth year]    INFECTIOUS DISEASE INITIAL CONSULTATION:  Length of hospital stay:  3 days.    SERVICE:  Med hospitalist 5.    REQUESTING PHYSICIAN:  Celene Skeen, MD.    IMPRESSION:  [name] is an 83 year old female with a past medical history of COPD, dementia, hypertension, schizophrenia and cerebral vascular accident, who currently presented as a transfer from [place] to [facility] on September 21, 2016, due to new onset of seizure as well as hypoxic respiratory failure requiring intubation.  MRI of the brain with and without contrast performed September 22, 2016, revealed a small left occipital convexity, subdural fluid collection suspicious for an empyema and less likely a hematoma.  The patient was originally initiated on vancomycin, cefepime and Flagyl prior to switching to vancomycin and meropenem.  EEG was within normal limits.  Blood culture collected September 21, 2016, is no growth for 3 days.  Urine culture collected September 21, 2016, is positive for greater than 100,000 colonies of ESBL E coli.  The patient has since been weaned off the ventilator.  Mental status has been improving.  This patient radiologic findings as well as new onset of seizures and elevated CRP are concerning for a subdural empyema.    RECOMMENDATIONS:  1. Continue to follow blood cultures.   2. Continue meropenem 1 gram IV q.8.  3. Continue vancomycin dose and trough per pharmacy per protocol.  4. Continue to follow CBC and differential, BUN, creatinine, AST, ALT, alkaline phosphatase, total bilirubin and CRP.   Thank you for this consultation.  We will continue to follow the patient.  Please call with any further questions.     Information was obtained from the patient and per the patient's chart.    REASON FOR CONSULTATION:  Subdural empyema.    HISTORY OF PRESENT ILLNESS:  [name] is an 83 year old African-American  female with a past medical history of COPD, dementia, hypertension, schizophrenia and cerebral vascular accident.  The patient is currently living in a nursing facility in [place], [place].  Per the nursing staff a [facility] facility the patient fell before Christmas in which she hit her head and lost consciousness. She ultimately had a laceration to her left scalp.  The patient was currently transferred from a nursing facility to [facility] due to a witnessed tonic-colonic seizure at the nursing facility. Nursing staff state that the patient did not have any complaints prior to witnessing seizure.  Upon transfer to [facility], the patient was unresponsive and hypoxic requiring intubation.  CT of the head did not show evidence of a stroke. Blood cultures collected 09/20/2016 prior to antibiotic iniation are no grwoth for 48 hours.  Urine culture was collected on 1/ 11/ 2018 is still pending. For further management, the patient was transferred to [facility] on September 21, 2016, where she was admitted into the MICU.  Upon admission to our hospital, the patient had a mild leukocytosis of 12.1 and CRP was elevated at 38.2.  Two sets of blood cultures were collected on admission, which are no growth for 3 days.  Urine culture collected September 21, 2016, is positive for greater than 100,000 colonies of ESBL E coli.  Chest x-ray collected September 21, 2016, revealed airspace opacification identified at the left lung base representing fluid and atelectasis or consolidation.  MRI of the brain with and  without contrast collected September 22, 2016, shows small left occipital convexity subdural fluid collection suspicious for an empyema and less likely hematoma.  CT of the brain without IV contrast confirms small left occipital convexity subdural fluid collection.  The patient was originally initiated on vancomycin, cefepime and Flagyl prior to being switched to vancomycin and meropenem.  The patient  was extubated on September 23, 2016, and transferred out of the ICU.  Neurology is following the patient.  Since transfer to a different unit, the patient's mental status has progressively improved.  She states that she is not currently in any pain.  She denies headache and neck stiffness.  She reports increased shortness of breath more recently with a nonproductive cough.    PAST MEDICAL HISTORY:  1. COPD.  2. Dementia.  3. ESBL producing bacterial infection.  4. Hypertension.  5. Schizophrenia.  6. Cerebral vascular accident.  7.  Congestive Heart Failure     PAST SURGICAL HISTORY:  1.  Partial Hysterectomy.  2.  Colonoscopy with poly removal   3.  Cholecystectomy     PAST FAMILY HISTORY:  Mother -  coronary artery disease.   Father - coronary artery disease.   Unsure of grandparents medical history.   Sister - sarcoidosis   Sister - Colon cancer   Son passed away from multiple myeloma   The rest of her children are healthy.    ALLERGIES:  1. SULFA causes itching and a rash.  2. PENICILLIN causes itching and a rash.    MEDICATIONS:    Current Facility-Administered Medications   Medication Dose Route Frequency   . diazePAM (VALIUM) tablet  5 mg Oral Q8H   . famotidine (PEPCID) 43m per 534moral liquid  20 mg Oral Daily   . heparin 5,000 unit/mL injection  5,000 Units Subcutaneous Q8HRS   . lanolin-oxyquin-pet, hydrophil (BAG BALM) topical ointment   Apply Topically 2x/day PRN   . levETIRAcetam (KEPPRA) tablet  500 mg Oral 2x/day   . meropenem (MERREM) IV injection 1 g  1 g Intravenous Q8H   . NS flush syringe  2 mL Intracatheter Q8HRS    And   . NS flush syringe  2-6 mL Intracatheter Q1 MIN PRN   . polyethylene glycol (MIRALAX) oral packet  17 g Oral Daily   . senna concentrate (SENNA) 52840mer 21m67mal liquid  10 mL Oral 2x/day   . vancomycin (VANCOCIN) 1 g in D5W 200 mL premix IVPB  1,000 mg Intravenous Q24H   . Vancomycin IV - Pharmacist to Dose per Protocol   Does not apply Daily PRN       ANTIBIOTICS:  1.  Vancomycin 1 gram IV q.24 started September 22, 2016.  2. Meropenem 1 gram IV q.8h started September 23, 2016.    SOCIAL HISTORY:  [place] is unsure how long she has been in the facility. Children states she has resided there for two years since diagnosis of dementia.  She states that she typically uses a wheelchair to get around.  She is currently widowed.  She has 4 children and 1 grandchild.  The patient reports that she has always worked within the home.  [place] traveled all over the country with her husband who used to be in the [employer].  She denies tobacco, alcohol and drug use.    REVIEW OF  SYSTEMS:  Limited due to the patient's altered mental status. Spoke with nurse at [facility] who states the patient was not complaining of any pain, dysuria, SOB, cough.   Constitutional:  Denies fever and chills.   Eyes:  Denies visual changes.   ENT:  Denies sore throat and sinus congestion.   Respiratory:  Reports increased shortness of breath with a nonproductive cough.   Cardiovascular:  Denies chest pain.  Reports problems with swelling all over.   Gastrointestinal:  Denies abdominal pain, nausea, vomiting and diarrhea.   Genitourinary:  Reports chronic urinary tract infections.  Denies dysuria and hematuria.   Musculoskeletal:  Denies myalgias and arthralgias.   Integumentary:  Denies cutaneous rash.   Neurologic:  Denies headache.  Reports widespread weakness status post cerebrovascular accident.   Endocrine:  Denies diabetes mellitus.    PHYSICAL EXAMINATION:  Filed Vitals:    09/24/16 0731 09/24/16 0733 09/24/16 1129 09/24/16 1131   BP:  (!) 140/94  (!) 148/73   Pulse:  80  71   Resp: _0 Temp: 36.6 C (97.9 F)  36.7 C (98.1 F)    SpO2:  100%  100%     Constitutional:  This is a chronically ill-appearing patient in no acute distress.   Neurologic:  The  patient is alert and oriented to person, place and time.  When speaking with the patient, we observed tremulous movements in arms and legs, followed by a cessation in conversation and difficulty in communicating with the patient afterward.  Able to follow one-step commands.   Eyes:  Sclerae nonicteric.  Conjunctiva not injected.   HEENT:  Buccal mucosa is moist.  The patient is edentulous.   Respiratory:  Lungs are clear to auscultation anteriorly.   Cardiovascular:  Heart rate is bradycardic with a regular rhythm without murmur.  Radial and posterior tibialis pulses are 2+ bilaterally. Right upper extremity is edematous.   Gastrointestinal:  Midline surgical scar present to the abdomen.  Abdomen is soft nondistended, nontender upon palpation.  Normoactive bowel sounds are present.   Musculoskeletal:  Neck is supple with full range of motion without stiffness.  Limited movement in the upper and lower extremities.   Integumentary:  Diffuse desquamation present to bilateral lower extremities as well as on the facial region. Hypopigmentation present to the chin. No abrasions noted.     LABORATORY DATA:  Results for orders placed or performed during the hospital encounter of 09/21/16 (from the past 24 hour(s))   C-REACTIVE PROTEIN(CRP),INFLAMMATION   Result Value Ref Range    CRP INFLAMMATION 152.5 (H) <=8.0 mg/L   CBC/DIFF    Narrative    The following orders were created for panel order CBC/DIFF.  Procedure                               Abnormality         Status                     ---------                               -----------         ------                     CBC WITH DIFF[[id number]]  Abnormal            Final result                 Please view results for these tests on the individual orders.   BASIC METABOLIC PANEL   Result Value Ref Range    SODIUM 142 136 - 145 mmol/L    POTASSIUM 3.0 (L) 3.5 - 5.1 mmol/L    CHLORIDE 108 96 - 111 mmol/L    CO2 TOTAL 26 22 - 32 mmol/L    ANION GAP 8 4 - 13  mmol/L    CALCIUM 8.7 8.5 - 10.2 mg/dL    GLUCOSE 98 65 - 139 mg/dL    BUN 2 (L) 8 - 25 mg/dL    CREATININE 0.53 0.49 - 1.10 mg/dL    BUN/CREA RATIO 4 (L) 6 - 22    ESTIMATED GFR >59 >59 mL/min/1.48m2   MAGNESIUM   Result Value Ref Range    MAGNESIUM 1.5 (L) 1.6 - 2.5 mg/dL   PHOSPHORUS   Result Value Ref Range    PHOSPHORUS 1.9 (L) 2.3 - 4.0 mg/dL   CBC WITH DIFF   Result Value Ref Range    WBC 7.8 3.5 - 11.0 x10^3/uL    RBC 3.53 (L) 3.63 - 4.92 x10^6/uL    HGB 10.9 (L) 11.2 - 15.2 g/dL    HCT 33.0 (L) 33.5 - 45.2 %    MCV 93.6 78.0 - 100.0 fL    MCH 30.9 27.4 - 33.0 pg    MCHC 33.1 32.5 - 35.8 g/dL    RDW 15.7 (H) 12.0 - 15.0 %    PLATELETS 250 140 - 450 x10^3/uL    MPV 7.2 (L) 7.5 - 11.5 fL    NEUTROPHIL % 61 %    LYMPHOCYTE % 24 %    MONOCYTE % 11 %    EOSINOPHIL % 3 %    BASOPHIL % 1 %    NEUTROPHIL # 4.79 1.50 - 7.70 x10^3/uL    LYMPHOCYTE # 1.85 1.00 - 4.80 x10^3/uL    MONOCYTE # 0.88 0.30 - 1.00 x10^3/uL    EOSINOPHIL # 0.25 0.00 - 0.50 x10^3/uL    BASOPHIL # 0.06 0.00 - 0.20 x10^3/uL   VANCOMYCIN, TROUGH   Result Value Ref Range    VANCOMYCIN TROUGH 10.1 10.0 - 20.0 ug/mL   TROPONIN-I   Result Value Ref Range    TROPONIN I 9 0 - 30 ng/L       MICROBIOLOGY:  1. Two sets of blood cultures collected September 21, 2016, are no growth for 3 days.  2. Urine culture collected September 21, 2016, is positive for greater than 100,000 colonies of ESBL E coli.  3. Two sets of blood cultures collected September 21, 2016, are no growth for 3 days.  4. BioFire panel is negative.  5. Bronchioloalveolar lavage performed September 21, 2016, is no growth for 3 days.  Gram stain shows several neutrophils, no organisms seen.  6. Urine Legionella antigen is negative.  7. Streptococcus pneumoniae urine antigen is negative.    IMAGING STUDIES:  Results for orders placed or performed during the hospital encounter of 09/21/16 (from the past 72 hour(s))   MRI BRAIN W/WO CONTRAST     Status: None    Narrative    [name]  Female, 83years  old.    MRI BRAIN W/WO CONTRAST performed on 09/22/2016 2:43 AM.    REASON FOR EXAM:  Hx of seizure like  episode - eval from intracranial  process      COMPARISON: None    TECHNIQUE: MR imaging of the brain was performed before and after 18 ml's  of Multihance      FINDINGS: There is no abnormal restricted diffusion to suggest acute  infarct. However, there is abnormal diffusion restriction involving a small  collection of subdural fluid versus blood along the left occipital  convexity. This is hyperintense on T1 and T2 which would reflect a late  subacute hematoma, however, there is no signal dropout within this fluid to  suggest blood and subsequently this is more concerning for a subdural  empyema. There is a small amount of adjacent subarachnoid hemorrhage.  Motion artifact limits detailed evaluation on postcontrast imaging. No  other definite mass lesions are identified. There is generalized brain  parenchymal volume loss. Thin section imaging through the temporal lobes  are unremarkable.        Impression    Small left occipital convexity subdural fluid collection suspicious for a  empyema and less likely hematoma given the blood sensitive sequence  findings and restricted diffusion.    This was discussed with Amanda Cockayne, APP, of the neuro critical care  unit.     XR AP MOBILE CHEST     Status: None    Narrative    XR AP MOBILE CHEST performed on 09/22/2016 8:07 AM    INDICATION: 83 years old Female  Hx of seizures and AMS s/p intubation - eval ET placement and acute lung  process    TECHNIQUE: 1 views of the chest; 1 images    COMPARISON: Chest radiograph dated September 21, 2016    FINDINGS: Minimal atelectasis is noted at the medial aspect of the left  lung base.  No pleural effusion or pneumothorax is visible.  The heart   size  is at the upper limits of normal.  The pulmonary vasculature is  unremarkable.    Endotracheal tube projects 4 cm above the carina.  Enteric tube sidehole projects over the  stomach.        Impression    Minimal left lung base atelectasis     CT BRAIN WO IV CONTRAST     Status: None (Preliminary result)    Narrative    [name]  Female, 83 years old.    CT BRAIN WO IV CONTRAST performed on 09/23/2016 8:33 PM.    REASON FOR EXAM:  subdural hematoma vs empyema      TECHNIQUE: 25m axial images of the brain from the vertex to the skull base  with reformatted coronal and sagittal images were obtained and reviewed in  bone, soft tissue, and stroke windows.     COMPARISON: MRI brain dated September 22, 2016.    FINDINGS: A small left occipital convexity subdural fluid collection is  better visualized on prior MRI. No new extra-axial fluid collection or  intracranial hemorrhage is identified. No mass effect, midline shift, or  hydrocephalus is present. A focal area of encephalomalacia in the superior  right cerebellum is consistent with a remote infarct. Gray-white interface  is otherwise preserved. Generalized volume loss is noted in the form of  prominent ventricles and sulci. The skull and scalp are within normal  limits. Trace secretions are present within the left frontal sinus. The  visualized remaining paranasal sinuses are well-aerated. Basal ganglia  calcifications are present bilaterally.      Impression    Small left occipital convexity subdural fluid collection is better  visualized on MRI and suspicious for empyema. No new intracranial  abnormality.           Monica Martinez, PA-C  09/24/2016, 14:35      I saw and examined the patient.  I reviewed the APP's note.  I agree with the findings and plan of care as documented in the note.  Any exceptions/additions are edited/noted.  Essentially a radiological diagnosis of subdural empyema in a 35F who is a NH resident presenting with new onset seizures.  May have to treat medically.    Leovardo Thoman R. Gloriajean Dell MD  Pager [phone]  09/24/2016          CC:   Celene Skeen, MD   Fax: [phone]       DD:  09/24/2016 12:28:16  DT:  09/24/2016 13:29:46 CB  D#:   [id number]

## 2016-09-24 NOTE — Nurses Notes (Addendum)
HR in 40s. Paged MD Welty. Will continue to monitor.     0124: HR back in 40s. Paged MD Welty. Will continue to monitor.     0126: Per MD Welty, monitor HR. Page back if HR sustains in the 30s, has a 3 second or longer pause, or is hypotensive. Will continue to monitor.

## 2016-09-24 NOTE — Consults (Signed)
[facility]  Cardiology Consult  Initial Consultation      BrodheadScott,[name], 83 y.o. female  Date of service: 09/24/2016  Date of Birth:  12/23/1933  Requestion Faculty: Medicine    Information Obtained from: patient and history reviewed via medical record  Chief Complaint: Low heart rate    Impression/Recommendations:  Patient is an 83 year old female with past medical history significant for COPD, dementia, schizophrenia and hypertension.  Cardiology is consulted for the slow heart rate. On review of telemetry, she had episodes of sinus arrhythmia, PACs and had Mobitz type I AV block representing suprahisian site. Patient was sleeping and no reported symptoms and she was not hypotensive. EKG showed sinus arrhythmia associated with incomplete RBBB and LAFB. Troponins negative and TSH normal. Most likely these conduction issues are due to patient's clinical course.  -> will not recommend a pacemaker as she is asymptomatic with these arrhythmias  -> continue to monitor otherwise and we will continue to follow with you       HPI:    Patient is an 83 year old female with past medical history significant for COPD, dementia, schizophrenia and hypertension.  Cardiology is consulted for the slow heart rate.  History mostly taken from the chart review as patient is delirious on encounter.    [facility] unresponsive and was subsequently intubated for airway protection.  Subsequent workup showed her to have seizure-like activity and she was put on Keppra.  She had a CT scan done on which she was found to have possible left occipital empyema.  Her course was further complicated by aspiration pneumonia and UTI.  Cardiology was consulted when overnight her heart rate dropped to the 40s multiple times.  On review of the tele, she was found to be in sinus a arrhythmia with PACs and had Mobitz type I AV block on at least 2 occasions.  Patient was sleeping at that time and there is no  reported history of hypotension.  EKG as per my read showed sinus arrhythmia as well.  Troponins are negative.  TSH is also normal. Of note, EKG as per my read also showed incomplete right bundle branch block and left anterior fascicular block.  Heart rate is back in the 70s now with normal sinus rhythm.        Past Medical History:   Diagnosis Date   . COPD (chronic obstructive pulmonary disease) (HCC)    . Dementia    . ESBL (extended spectrum beta-lactamase) producing bacteria infection 09/21/2016    Urine - Escherichia coli (ESBL)   . HTN (hypertension)    . Schizophrenia (HCC)                Prior to Admission Medications:  Medications Prior to Admission     Prescriptions    ciprofloxacin HCl (CIPRO) 500 mg Oral Tablet    Take 500 mg by mouth Twice daily    levoFLOXacin (LEVAQUIN) 500 mg Oral Tablet    Take 500 mg by mouth Once a day    pantoprazole (PROTONIX) 20 mg Oral Tablet, Delayed Release (E.C.)    Take 20 mg by mouth Every morning before breakfast        Inpatient Medications:    Current Facility-Administered Medications:  diazePAM (VALIUM) tablet 5 mg Oral Q8H   famotidine (PEPCID) 40mg  per 5mL oral liquid 20 mg Oral Daily   heparin 5,000 unit/mL injection 5,000 Units Subcutaneous Q8HRS   levETIRAcetam (KEPPRA) tablet 500 mg Oral  2x/day   meropenem (MERREM) IV injection 1 g 1 g Intravenous Q8H   NS flush syringe 2 mL Intracatheter Q8HRS   And      NS flush syringe 2-6 mL Intracatheter Q1 MIN PRN   polyethylene glycol (MIRALAX) oral packet 17 g Oral Daily   senna concentrate (SENNA) 528mg  per 15mL oral liquid 10 mL Oral 2x/day   vancomycin (VANCOCIN) 1 g in D5W 200 mL premix IVPB 1,000 mg Intravenous Q24H   Vancomycin IV - Pharmacist to Dose per Protocol  Does not apply Daily PRN     Allergies   Allergen Reactions   . Sulfa (Sulfonamides)    . Bactrim [Sulfamethoxazole-Trimethoprim] Swelling   . Penicillins Swelling     Dye Allergy:  No  Iodine Allergy:  No    Family History  Unable to obtain as patient  is confused    Social History  Social History     Social History   . Marital status: Widowed     Spouse name: N/A   . Number of children: N/A   . Years of education: N/A     Occupational History   . Not on file.     Social History Main Topics   . Smoking status: Not on file   . Smokeless tobacco: Not on file   . Alcohol use Not on file   . Drug use: Not on file   . Sexual activity: Not on file     Other Topics Concern   . Not on file     Social History Narrative       ROS: Review of systems was not obtained due to Confusion .    Exam:  Temperature: 36.7 C (98.1 F)  Heart Rate: 80  BP (Non-Invasive): (!) 140/94  Respiratory Rate: 17  SpO2-1: 100 %  Pain Score (Numeric, Faces): 0  GENERAL: Alert but confused and responding intermittently to commands  PSYCHOLOGICAL: Is alert but confused  SKIN: dry, intact, without erythema or rash and without edema  HEAD/EARS/NOSE/THROAT:  OP clear / well-visualized, Normocephalic, atraumatic  NECK: No lesions  HEART: Normal heart rate, regular rhythm. and no murmurs, rubs, or gallops.  CHEST WALL AND LUNGS: Chest excursions symmetric bilaterally. Lung sounds clear to auscultation bilaterally  ABDOMEN: The abdominal sounds are present.   NEUROLOGICAL: Unable to assess due to confusion      Labs:  I have reviewed all lab results.      Joellyn Rued, MD      09/24/2016  I saw and examined the patient.  I reviewed the fellow's note.  I agree with the findings and plan of care as documented in the fellow's note.  Any exceptions/additions are edited/noted.    Laurie Panda, MD

## 2016-09-24 NOTE — Pharmacy Vancomycin Dosing (Signed)
[facility] / Department of Pharmaceutical Services  Therapeutic Drug Monitoring: Vancomycin  09/24/2016      Patient name: Cleatrice BurkeScott,Laylanie  Date of Birth:  02/21/1934    Actual Weight:  Weight: 88.6 kg (195 lb 5.2 oz) (09/24/16 0731)     BMI:  BMI (Calculated): 30.72 (09/24/16 0731)    Date RPh Current regimen (including mg/kg) Indication Target Levels (mcg/mL) SCr (mg/dL) CrCl* (mL/min) Measured level (mcg/mL) Plan (including when levels are due) Comments   1/12 jmq  empiric 13-17 0.69   Vancomycin1500mg x1,Cefepime 2gmx1 [facility] reportedly was coming to ED from a nursing home due to seizures , pt became unresponsive and hypoxic upon her arrival   1/12 [place] Vancomycin 1500 mg IV x 1 in ED  Empiric coverage of pneumonia 13 0.64 ~50; making urine n/a - Continue vancomycin 1000 mg IV every 24 hours   - To start tomorrow morning  - Monitor SCr and UOP   - Deescalate as able - WBC slightly elevated  - Urine and blood cx pending    1/15 [facility] Vanc 1gm q24h Empiric coverage of pneumonia 13-17 0.53 96 10.1 drawn 24.9 hrs post dose Continue current dose and check in 3-4 days WBC wnl  Cultures neg  Possibility of accumulation  Monitor renal function closely                                                     *Creatinine clearance is estimated by using the Cockcroft-Gault equation for adult patients and the Brendolyn PattySchwartz equation for pediatric patients.    The decision to discontinue vancomycin therapy will be determined by the primary service.  Please contact the pharmacist with any questions regarding this patient's medication regimen.

## 2016-09-24 NOTE — Care Plan (Signed)
Problem: Patient Care Overview (Adult,OB)  Goal: Plan of Care Review(Adult,OB)  The patient and/or their representative will communicate an understanding of their plan of care   Outcome: Ongoing (see interventions/notes)  Patient will return to [facility] upon discharge, transport via ambulance.

## 2016-09-24 NOTE — Consults (Signed)
[facility]  Neurology Consult Follow Up    Rice TractsScott,[name], 83 y.o. female  Date of Service: 09/24/2016  Date of Birth:  06/13/1934    Hospital Day:  LOS: 3 days       Chief Complaint:  Seizure like activity  Subjective:   Patient alert this AM. States she has "all over" weakness. Patient has bilateral LE weakness, unclear if this is new finding.       Objective:  Temperature: 36.6 C (97.9 F)  Heart Rate: 80  BP (Non-Invasive): (!) 140/94  Respiratory Rate: 15  SpO2-1: 100 %  Pain Score (Numeric, Faces): 0  Glasgow: Total score only: 15  General: Alert, Baseline dementia, african [place] elderly female. No acute distress.   Mental status:Disoriented to time and Disoriented to place  Memory: Follows commands 1 steps  Attention: Attention decreased and Concentration decreased  Knowledge: Good  Language and Speech: Normal and Normal  Cranial nerves: Cranial nerves 2-12 are normal  Muscle tone: WNL  Motor strength:  Normal strength except noted at generalized weakness, bilateral lower extremity weakness.   Sensory: Sensory exam in the upper and lower extremities is normal.   Gait: Unable to cooperate  Coordination: No resting tremor.   Reflexes: Normal except as noted: Decreased in bilateral AJ, right KJ.   Diabetes Monitors:  Patient not a diabetic.    Current Comorbid Conditions:  -None of the following conditions apply  Coma less than 8:  Coma -Not applicable    Labs:  I have reviewed all lab results.    Radiology  Studies:   MRI brain w/wo 09/22/2016: "Small left occipital convexity subdural fluid collection suspicious for a empyema and less likely hematoma given the blood sensitive sequence findings and restricted diffusion. This was discussed with [name] SheffieldJonathan [name], APP, of the neuro critical careunit."    CT brain wo 09/23/2016: "Small left occipital convexity subdural fluid collection is better  visualized on MRI and suspicious for empyema. No new intracranial  abnormality."    Routine EEG 09/22/2016:  "This is a digitally acquired EEG following the standard 10/20 system of electrode placement.  This EEG runs between 10:55 a.m. on September 21, 2016, until 1:31 a.m. on September 22, 2016.  This time period recorded the following.Marland Kitchen.Marland Kitchen.The EEG described above is within normal limits.  However, a normal EEG does not rule out the possibility of seizure disorder and clinical correlation is recommended."    Assessment/Recommendations:  [name] is a 83 y.o.,  female with PMH with no history of seizure, who is transferred from outside facility intubated and sedating after having a witnessed event. She was treated, then required intubation at outside facility. PMH of Dementia, COPD, schizophrenia affective d/o, anemia, hypokalemia, GERD, depression.     Seizure like activity:  - Would continue Keppra 500 mg BID as patient has likely empyema which may lead to seizures.   - vEEG negative for seizure.    - Recommend contacting patients facility to determine her baseline (i.e. Use wheel chair, etc.).  - Recommend Neuro checks, seizure precautions, aspiration precautions. Patient not to drive for the next 6 months, patient says she does not .   - Agree with NSGY consult.   - Agree with ID consult.   - Neurology will continue to follow peripherally. Thank you for the consult. Please page Neurology with any questions.     [name] HareMelissa Moore, MD   PGY-2 Neurology  [employer]  Pager# [phone]      I  saw and examined the patient.  I reviewed the resident's note.  I agree with the findings and plan of care as documented in the resident's note.  Any exceptions/additions are edited/noted.    [name] Payor, MD

## 2016-09-24 NOTE — Nurses Notes (Signed)
Pt cleared and consented for MRI with MPOA/Daugther Harriette BouillonJoyce Buschman [phone]). Education provided and questions encouraged and answered as needed at this time. Pt not A&O at this time according to nursing staff.     Tempie DonningHannah Strader, RN  09/24/2016, 14:05

## 2016-09-25 ENCOUNTER — Inpatient Hospital Stay (HOSPITAL_COMMUNITY): Payer: Medicare Other

## 2016-09-25 DIAGNOSIS — E279 Disorder of adrenal gland, unspecified: Secondary | ICD-10-CM

## 2016-09-25 DIAGNOSIS — M4807 Spinal stenosis, lumbosacral region: Secondary | ICD-10-CM

## 2016-09-25 DIAGNOSIS — M5137 Other intervertebral disc degeneration, lumbosacral region: Secondary | ICD-10-CM

## 2016-09-25 DIAGNOSIS — M47816 Spondylosis without myelopathy or radiculopathy, lumbar region: Secondary | ICD-10-CM

## 2016-09-25 DIAGNOSIS — M47817 Spondylosis without myelopathy or radiculopathy, lumbosacral region: Secondary | ICD-10-CM

## 2016-09-25 DIAGNOSIS — M5136 Other intervertebral disc degeneration, lumbar region: Secondary | ICD-10-CM

## 2016-09-25 DIAGNOSIS — M48061 Spinal stenosis, lumbar region without neurogenic claudication: Secondary | ICD-10-CM

## 2016-09-25 LAB — BASIC METABOLIC PANEL
ANION GAP: 7 mmol/L (ref 4–13)
BUN/CREA RATIO: 4 — ABNORMAL LOW (ref 6–22)
BUN: 2 mg/dL — ABNORMAL LOW (ref 8–25)
CALCIUM: 8.9 mg/dL (ref 8.5–10.2)
CHLORIDE: 106 mmol/L (ref 96–111)
CO2 TOTAL: 28 mmol/L (ref 22–32)
CREATININE: 0.52 mg/dL (ref 0.49–1.10)
ESTIMATED GFR: >59 mL/min/1.73mˆ2 (ref >59)
GLUCOSE: 91 mg/dL (ref 65–139)
POTASSIUM: 3.5 mmol/L (ref 3.5–5.1)
SODIUM: 141 mmol/L (ref 136–145)

## 2016-09-25 LAB — CBC WITH DIFF
BASOPHIL #: 0.04 x10ˆ3/uL (ref 0.00–0.20)
BASOPHIL %: 1 %
EOSINOPHIL #: 0.16 x10ˆ3/uL (ref 0.00–0.50)
EOSINOPHIL %: 3 %
HCT: 34.2 % (ref 33.5–45.2)
HGB: 11.8 g/dL (ref 11.2–15.2)
LYMPHOCYTE #: 2.16 x10ˆ3/uL (ref 1.00–4.80)
LYMPHOCYTE %: 36 %
MCH: 32.7 pg (ref 27.4–33.0)
MCHC: 34.5 g/dL (ref 32.5–35.8)
MCV: 94.9 fL (ref 78.0–100.0)
MONOCYTE #: 0.71 x10ˆ3/uL (ref 0.30–1.00)
MONOCYTE %: 12 %
MPV: 7.2 fL — ABNORMAL LOW (ref 7.5–11.5)
NEUTROPHIL #: 2.97 x10ˆ3/uL (ref 1.50–7.70)
NEUTROPHIL %: 49 %
PLATELETS: 246 x10ˆ3/uL (ref 140–450)
RBC: 3.61 x10ˆ6/uL — ABNORMAL LOW (ref 3.63–4.92)
RDW: 15.9 % — ABNORMAL HIGH (ref 12.0–15.0)
WBC: 6 x10ˆ3/uL (ref 3.5–11.0)

## 2016-09-25 LAB — MAGNESIUM: MAGNESIUM: 1.8 mg/dL (ref 1.6–2.5)

## 2016-09-25 LAB — PHOSPHORUS: PHOSPHORUS: 1.4 mg/dL — ABNORMAL LOW (ref 2.3–4.0)

## 2016-09-25 MED ORDER — GADOBENATE DIMEGLUMINE 529 MG/ML(0.1 MMOL/0.2 ML) INTRAVENOUS SOLUTION
18.00 mL | INTRAVENOUS | Status: AC
Start: 2016-09-25 — End: 2016-09-25
  Administered 2016-09-25: 02:00:00 18 mL via INTRAVENOUS

## 2016-09-25 MED ADMIN — levETIRAcetam 500 mg tablet: ORAL | @ 08:00:00

## 2016-09-25 MED ADMIN — heparin (porcine) 5,000 unit/mL injection solution: SUBCUTANEOUS | @ 21:00:00

## 2016-09-25 MED ADMIN — sodium chloride 0.9 % (flush) injection syringe: @ 21:00:00

## 2016-09-25 MED ADMIN — sodium chloride 0.9 % (flush) injection syringe: ORAL | @ 08:00:00

## 2016-09-25 MED ADMIN — sodium chloride 0.9 % (flush) injection syringe: ORAL | @ 21:00:00

## 2016-09-25 NOTE — Pharmacy Vancomycin Dosing (Signed)
[facility] / Department of Pharmaceutical Services  Therapeutic Drug Monitoring: Vancomycin  09/25/2016      Patient name: [name],[name]  Date of Birth:  02/25/1934    Actual Weight:  Weight: 88.6 kg (195 lb 5.2 oz) (09/24/16 0731)     BMI:  BMI (Calculated): 30.72 (09/24/16 0731)    Date RPh Current regimen (including mg/kg) Indication Target Levels (mcg/mL) SCr (mg/dL) CrCl* (mL/min) Measured level (mcg/mL) Plan (including when levels are due) Comments   1/12 jmq  empiric 13-17 0.69   Vancomycin1500mg x1,Cefepime 2gmx1 [facility] was coming to ED from a nursing home due to seizures , pt became unresponsive and hypoxic upon her arrival   1/12 [place] Vancomycin 1500 mg IV x 1 in ED  Empiric coverage of pneumonia 13 0.64 ~50; making urine n/a - Continue vancomycin 1000 mg IV every 24 hours   - To start tomorrow morning  - Monitor SCr and UOP   - Deescalate as able - WBC slightly elevated  - Urine and blood cx pending    [facility] Empiric coverage of pneumonia 13-17 0.53 96 10.1 drawn 24.9 hrs post dose Continue current dose and check in 3-4 days WBC wnl  Cultures neg  Possibility of accumulation  Monitor renal function closely   1/16   ROH Vanc dc'd                                                *Creatinine clearance is estimated by using the Cockcroft-Gault equation for adult patients and the Brendolyn PattySchwartz equation for pediatric patients.    The decision to discontinue vancomycin therapy will be determined by the primary service.  Please contact the pharmacist with any questions regarding this patient's medication regimen.

## 2016-09-25 NOTE — Ancillary Notes (Signed)
[facility]  MRI Technologist Note        MRI has been completed.        Georgina Snellullen Shaheen Star, RT (R)(MR) 09/25/2016, 01:48

## 2016-09-25 NOTE — Consults (Signed)
PATIENT NAMEAlima, [name] [facility]   HOSPITAL NUMBER:  [id number]  DATE OF SERVICE: 09/25/2016  DATE OF BIRTH:  Nov 05, 1933    CONSULTATION    INFECTIOUS DISEASE CONSULT FOLLOWUP NOTE    LENGTH OF HOSPITAL STAY:  Four days.    REASON FOR CONSULT:  Suspected subdural empyema.    SUBJECTIVE:  [name] is currently resting in bed.  She is slightly more confused in comparison to yesterday.  However, she is alert and oriented to person, place and time.  Reports doing well overall.  She states that she did suffer a left head contusion back around Christmas time.  She stated it required sutures.    OBJECTIVE:  Filed Vitals:    09/25/16 0734 09/25/16 0904 09/25/16 1100 09/25/16 1155   BP: (!) 149/54   (!) 157/73   Pulse: 67   62   Resp: (!) 23 17  (!) 22   Temp:  37.3 C (99.1 F) 36.8 C (98.2 F)    SpO2: 98%   100%     Constitutional:  This is a chronically ill-appearing patient in no acute distress.   Neurologic:  The patient is alert and oriented to person and place.   Eyes:  Sclerae nonicteric.  Conjunctivae not injected.   Integumentary:  No signs of recent laceration to the scalp.  However, exam is limited by matting of hair.  No cutaneous rash appreciated on exam.   Extremities:  Edema to the right upper extremity.    ANTIBIOTICS:  1. Vancomycin 1 gram IV q.24 h started September 22, 2016.  2. Meropenem 1 g IV q.8 h started September 23, 2016.    LINES:  1. IV to the right arm placed September 22, 2016.  2. IV to the right wrist placed September 22, 2016.    LABORATORY DATA:  Results for orders placed or performed during the hospital encounter of 09/21/16 (from the past 24 hour(s))   CBC/DIFF    Narrative    The following orders were created for panel order CBC/DIFF.  Procedure                               Abnormality         Status                     ---------                               -----------         ------                     CBC WITH DIFF[[id number]]                Abnormal            Final result                 Please  view results for these tests on the individual orders.   BASIC METABOLIC PANEL   Result Value Ref Range    SODIUM 141 136 - 145 mmol/L    POTASSIUM 3.5 3.5 - 5.1 mmol/L    CHLORIDE 106 96 - 111 mmol/L    CO2 TOTAL 28 22 - 32 mmol/L    ANION GAP 7 4 - 13 mmol/L    CALCIUM 8.9 8.5 - 10.2 mg/dL  GLUCOSE 91 65 - 139 mg/dL    BUN 2 (L) 8 - 25 mg/dL    CREATININE 1.610.52 0.960.49 - 1.10 mg/dL    BUN/CREA RATIO 4 (L) 6 - 22    ESTIMATED GFR >59 >59 mL/min/1.5254m^2   MAGNESIUM   Result Value Ref Range    MAGNESIUM 1.8 1.6 - 2.5 mg/dL   PHOSPHORUS   Result Value Ref Range    PHOSPHORUS 1.4 (L) 2.3 - 4.0 mg/dL   CBC WITH DIFF   Result Value Ref Range    WBC 6.0 3.5 - 11.0 x10^3/uL    RBC 3.61 (L) 3.63 - 4.92 x10^6/uL    HGB 11.8 11.2 - 15.2 g/dL    HCT 04.534.2 40.933.5 - 81.145.2 %    MCV 94.9 78.0 - 100.0 fL    MCH 32.7 27.4 - 33.0 pg    MCHC 34.5 32.5 - 35.8 g/dL    RDW 91.415.9 (H) 78.212.0 - 15.0 %    PLATELETS 246 140 - 450 x10^3/uL    MPV 7.2 (L) 7.5 - 11.5 fL    NEUTROPHIL % 49 %    LYMPHOCYTE % 36 %    MONOCYTE % 12 %    EOSINOPHIL % 3 %    BASOPHIL % 1 %    NEUTROPHIL # 2.97 1.50 - 7.70 x10^3/uL    LYMPHOCYTE # 2.16 1.00 - 4.80 x10^3/uL    MONOCYTE # 0.71 0.30 - 1.00 x10^3/uL    EOSINOPHIL # 0.16 0.00 - 0.50 x10^3/uL    BASOPHIL # 0.04 0.00 - 0.20 x10^3/uL       MICROBIOLOGY:  1. Three sets of blood cultures collected September 21, 2016, are no growth for 4 days.  2. Urine culture collected September 21, 2016, is positive for ESBL E coli.  3. Two sets of repeat blood cultures collected September 21, 2016, are no growth for 4 days.  4. Respiratory BioFire panel collected September 21, 2016, is negative.  5. Bronchoalveolar lavage performed September 21, 2016, is no growth for 3 days.  Gram stain shows several neutrophils, no organisms seen.  6. Legionella urine antigen performed September 21, 2016, is negative.  7. Streptococcus pneumoniae urine antigen performed September 21, 2016, is negative.    IMAGING STUDIES:  Results for orders placed or performed  during the hospital encounter of 09/21/16 (from the past 72 hour(s))   CT BRAIN WO IV CONTRAST     Status: None    Narrative    [name]  Female, 83 years old.    CT BRAIN WO IV CONTRAST performed on 09/23/2016 8:33 PM.    REASON FOR EXAM:  subdural hematoma vs empyema      TECHNIQUE: 5mm axial images of the brain from the vertex to the skull base  with reformatted coronal and sagittal images were obtained and reviewed in  bone, soft tissue, and stroke windows.     COMPARISON: MRI brain dated September 22, 2016.    FINDINGS: A small left occipital convexity subdural fluid collection is  better visualized on prior MRI. No new extra-axial fluid collection or  intracranial hemorrhage is identified. No mass effect, midline shift, or  hydrocephalus is present. A focal area of encephalomalacia in the superior  right cerebellum is consistent with a remote infarct. Gray-white interface  is otherwise preserved. Generalized volume loss is noted in the form of  prominent ventricles and sulci. The skull and scalp are within normal  limits. Trace secretions are present within the left frontal  sinus. The  visualized remaining paranasal sinuses are well-aerated. Basal ganglia  calcifications are present bilaterally.      Impression    Small left occipital convexity subdural fluid collection is better  visualized on MRI. Noncontrast CT cannot differentiate between empyema and  subdural hematoma. No new intracranial abnormality.     MRI SPINE LUMBOSACRAL W/WO CONTRAST     Status: None    Narrative    [name]  Female, 83 years old.    MRI SPINE LUMBOSACRAL W/WO CONTRAST performed on 09/25/2016 1:48 AM.    REASON FOR EXAM:  assess for stenosis, abscess      INTRAVENOUS CONTRAST: 18 ml's of Multihance  CREATININE/GFR: 0.53 mg/dL on 04/20/9146 WGN>56    COMPARISON: None    TECHNIQUE: Coronal STIR, Sagittal T1-, T2-, and axial T1- and T2-w images  of the lumbar spine. Postcontrast fat saturated sagittal T1 and axial T1  images were  performed.    FINDINGS: There is anterolisthesis of L3 relative to L4 and L4 relative to  L5. These are degenerative in nature. No definite fractures are  appreciated. Advanced facet hypertrophic changes are noted at the L3-L4,  L4-5 and L5-S1 levels along with ligamentum flavum hypertrophy.    Evaluation of the individual levels demonstrates:    T12-L1: Unremarkable.    L1-2: Unremarkable.    L2-3: Mild broad-based disc bulging with mild facet hypertrophy with mild  spinal canal and mild foraminal stenosis.    L3-4: There is a broad-based disc bulging along with moderate facet  hypertrophy with moderate foraminal stenosis and mild spinal canal  stenosis.    L4-5: moderate degree of disc bulging along with facet hypertrophy and  ligamentum flavum hypertrophy with mild to moderate spinal canal stenosis  and moderate foraminal stenosis.    L5-S1: Loss of disc height, asymmetric to the left associated with endplate  edema along with moderate facet hypertrophy with moderate bilateral  foraminal stenosis, and mild spinal canal stenosis.    There is a large 8 x 7 x 9 cm left adrenal mass lesion that inferiorly  displaces the left kidney that is at least partially enhancing and can be  completely evaluated with a CT adrenal protocol.      Impression    1. Mild to moderate degenerative changes of the lumbar spine worse at the  L3 through the S1 level with up to moderate foraminal stenosis.Marland Kitchen  2. An incompletely evaluated large 8 x 7 x 9 cm left adrenal mass lesion  that inferiorly displaces the left kidney that is at least partially  enhancing and can be completely evaluated with a CT adrenal protocol.         ASSESSMENT:   [name] is an 83 year old female with a past medical history of COPD, dementia, hypertension, schizophrenia and cerebral vascular accident who currently presented as a transfer from [facility] to [facility] on September 21, 2016, due to new onset of seizures as well as hypoxic respiratory  failure requiring intubation.  MRI of the brain with and without contrast performed September 22, 2016, revealed a small left occipital convexity and subdural fluid collection suspicious for an empyema, less likely hematoma.  Urine culture collected September 21, 2016, was positive for greater than 100,000 colonies of ESBL E coli.  The patient was currently being treated with vancomycin and meropenem.    Spoke to the nursing staff at [facility], who confirmed that the patient fell around Christmas time and sustained a  head contusion with laceration to the left aspect of the scalp.  The patient was sent to the hospital for brief period of time, where CT scan ruled out any trauma.  Given this confirmed information, we feel that this is most likely a resolving subdural hematoma as opposed to an empyema.  The patient has received sufficient antibiotics to treat ESBL urinary tract infection.    RECOMMENDATIONS:  1. Discontinue antibiotics.     Thank you for this consultation.  We will sign off today.  Please call with any further questions.        Lindaann Pascal, PA-C  09/25/2016, 16:57      I saw and examined the patient.  I reviewed the APP's note.  I agree with the findings and plan of care as documented in the note.  Any exceptions/additions are edited/noted.  Based on further data collection it is increasingly likely that this is not an infectious process but a chronic subdural hematoma that is resolving.    Lashayla Armes R. Madolyn Frieze MD  Pager [phone]  09/25/2016              DD:  09/25/2016 15:17:43  DT:  09/25/2016 15:34:53 LW  D#:  [id number]

## 2016-09-25 NOTE — Consults (Signed)
[facility]  Neurosurgery Consult  Follow Up Note    [name] BurkeScott,Nycole, 83 y.o. female  Date of Service: 09/25/2016  Date of Birth:  09/21/1933    Hospital Day:  LOS: 4 days     [name] is a 83 y.o., White female with PMH of COPD, Dementia, schizophrenia, HTN who presents with seizures found to have small L occipital subdural empyema vs hematoma less likely. HD5    --AEDs per neurology  --No neurosurgical intervention recommended at this time, recommend medical management.   --Will see the patient in 2 weeks with a CT brain w/wo contrast (orders placed). Will follow the patient peripherally while in-house, if she remains inpatient, will consider repeating an MRI brain w/wo contrast at that point instead.  --Please call with any questions.     Michaelene SongAbby Hann, PA-C  [facility]  09/25/2016, 08:06   Pager #[phone]      Anice Paganiniara Anderia Lorenzo, MD  09/27/2016, 18:14

## 2016-09-25 NOTE — Procedures (Signed)
[name]   HOSPITAL NUMBER:  [id number]  DATE OF SERVICE:  09/22/2016  DOB:  Jul 23, 1934  SEX:  F      Date of Study:  September 22, 2016.     Status:  This is an inpatient study.  Technician: CP.  EEG #:  [id number].    INTERPRETATION:  This is a normal awake and asleep EEG.    REPORT:  This is an extended bedside study done using the standard 10-20 system of electrode placement.  This EEG runs between 3:23 a.m. on September 22, 2016, until 1309 on September 23, 2016.  This time period recorded the following.    CLINICAL EVENTS:  There are no pushbutton events recorded.    INTERICTAL ABNORMALITIES:  Throughout the study, there is a normal background frequency over the posterior head regions.  Both sleep and wake states are recorded.    CLINICAL CORRELATION:  The EEG described above is essentially within normal limits.  No electrographic seizures are recorded.        Jamse Arnracy Gilberta Peeters, MD  Assistant Professor   [place] [facility] of Neurology          DD:  09/25/2016 11:31:56  DT:  09/25/2016 11:54:13 KF  D#:  [id number]

## 2016-09-25 NOTE — Progress Notes (Signed)
[facility]  Medicine Progress Note    [name]  Date of service: 09/25/2016  Date of Admission:  09/21/2016    Hospital Day:  LOS: 4 days   Subjective: She is more confused today. Seems that she has periods of waxing and waning ability to interact. Yesterday she was able to talk to me in complete sentences without any difficulty. Today she has had problems even starting sentences.    Vital Signs:  Temp  Avg: 37.1 C (98.7 F)  Min: 36.7 C (98.1 F)  Max: 37.4 C (99.3 F)    Pulse  Avg: 66.3  Min: 58  Max: 71 BP  Min: 135/54  Max: 154/60   Resp  Avg: 18.8  Min: 16  Max: 24 SpO2  Avg: 99 %  Min: 97 %  Max: 100 %   Pain Score (Numeric, Faces): 0      Input/Output    Intake/Output Summary (Last 24 hours) at 09/25/16 0746  Last data filed at 09/24/16 1326   Gross per 24 hour   Intake              340 ml   Output                0 ml   Net              340 ml    I/O last shift:          Current Facility-Administered Medications:  diazePAM (VALIUM) tablet 5 mg Oral Q8H   famotidine (PEPCID) 40mg  per 5mL oral liquid 20 mg Oral Daily   heparin 5,000 unit/mL injection 5,000 Units Subcutaneous Q8HRS   lanolin-oxyquin-pet, hydrophil (BAG BALM) topical ointment  Apply Topically 2x/day PRN   levETIRAcetam (KEPPRA) tablet 500 mg Oral 2x/day   meropenem (MERREM) IV injection 1 g 1 g Intravenous Q8H   NS flush syringe 2 mL Intracatheter Q8HRS   And      NS flush syringe 2-6 mL Intracatheter Q1 MIN PRN   polyethylene glycol (MIRALAX) oral packet 17 g Oral Daily   senna concentrate (SENNA) 528mg  per 15mL oral liquid 10 mL Oral 2x/day   vancomycin (VANCOCIN) 1 g in D5W 200 mL premix IVPB 1,000 mg Intravenous Q24H   Vancomycin IV - Pharmacist to Dose per Protocol  Does not apply Daily PRN     Physical Exam:   General:Cooperative female in no acute distress.   Lungs: Clear to auscultation bilaterally. No crackles, rales or wheezing   Cardiovascular: regular rate and rhythm, S1, S2 normal, no murmur, click, rub or gallop      Abdomen: Soft, non-tender, Bowel sounds normal, non-distended, No hepatosplenomegaly   Extremities: No cyanosis or edema  Neurologic: Alert. Unable to talk in complete sentences today - could not make out what she wanted to say.        Labs:    CBC:  Recent Labs      09/23/16   0411  09/24/16   0413  09/25/16   0716   WBC  9.2  7.8  6.0   HGB  10.4*  10.9*  11.8   HCT  30.7*  33.0*  34.2   PLTCNT  244  250  246    RBC Indices:  Recent Labs      09/23/16   0411  09/24/16   0413  09/25/16   0716   MCV  92.6  93.6  94.9   MCH  31.3  30.9  32.7   MCHC  33.8  33.1  34.5   RDW  16.0*  15.7*  15.9*   MPV  6.8*  7.2*  7.2*      Differential:   Recent Labs      09/23/16   0411  09/24/16   0413  09/25/16   0716   PMNS  70  61  49   LYMPHOCYTES  18  24  36   MONOCYTES  9  11  12    EOSINOPHIL  3  3  3    BASOPHILS  1   0.07  1   0.06  1   0.04   PMNABS  6.38  4.79  2.97   LYMPHSABS  1.62  1.85  2.16   MONOSABS  0.80  0.88  0.71   EOSABS  0.29  0.25  0.16     BMP:  Recent Labs      09/23/16   0411  09/24/16   0413   SODIUM  140  142   POTASSIUM  3.4*  3.0*   CHLORIDE  109  108   CO2  21*  26   BUN  6*  2*   CREATININE  0.57  0.53   GFR  >59  >59   ANIONGAP  10  8   GLUCOSENF  63*  98   CALCIUM  8.6  8.7   MAGNESIUM  1.7  1.5*   PHOSPHORUS  2.4  1.9*     Liver/Pancreas:  No results for input(s): TOTALPROTEIN, ALBUMIN, PREALBUMIN, AST, ALT, ALKPHOS, LDH, AMYLASE, LIPASE, TOTBILIRUBIN, BILIRUBINCON in the last 72 hours.    Invalid input(s): GGT  Cardiac Markers:  Recent Labs      09/24/16   0413   TROPONINI  9     Coagulation Studies:  No results for input(s): INR, PROTHROMTME, APTT in the last 72 hours.    Invalid input(s): PTT, PT, CREACTPROTIE  ABG's  No results found for this encounter    Results for orders placed or performed during the hospital encounter of 09/21/16 (from the past 72 hour(s))   XR AP MOBILE CHEST     Status: None    Narrative    XR AP MOBILE CHEST performed on 09/22/2016 8:07 AM    INDICATION: 83 years old  Female  Hx of seizures and AMS s/p intubation - eval ET placement and acute lung  process    TECHNIQUE: 1 views of the chest; 1 images    COMPARISON: Chest radiograph dated September 21, 2016    FINDINGS: Minimal atelectasis is noted at the medial aspect of the left  lung base.  No pleural effusion or pneumothorax is visible.  The heart   size  is at the upper limits of normal.  The pulmonary vasculature is  unremarkable.    Endotracheal tube projects 4 cm above the carina.  Enteric tube sidehole projects over the stomach.        Impression    Minimal left lung base atelectasis     CT BRAIN WO IV CONTRAST     Status: None (Preliminary result)    Narrative    [name]  Female, 83 years old.    CT BRAIN WO IV CONTRAST performed on 09/23/2016 8:33 PM.    REASON FOR EXAM:  subdural hematoma vs empyema      TECHNIQUE: 5mm axial images of the brain from the vertex to the skull base  with reformatted coronal and sagittal images were  obtained and reviewed in  bone, soft tissue, and stroke windows.     COMPARISON: MRI brain dated September 22, 2016.    FINDINGS: A small left occipital convexity subdural fluid collection is  better visualized on prior MRI. No new extra-axial fluid collection or  intracranial hemorrhage is identified. No mass effect, midline shift, or  hydrocephalus is present. A focal area of encephalomalacia in the superior  right cerebellum is consistent with a remote infarct. Gray-white interface  is otherwise preserved. Generalized volume loss is noted in the form of  prominent ventricles and sulci. The skull and scalp are within normal  limits. Trace secretions are present within the left frontal sinus. The  visualized remaining paranasal sinuses are well-aerated. Basal ganglia  calcifications are present bilaterally.      Impression    Small left occipital convexity subdural fluid collection is better  visualized on MRI. Noncontrast CT cannot differentiate between empyema and  subdural hematoma. No new  intracranial abnormality.           PT/OT: Yes    Consults: NSGY, neurology, Cardiology (to be called)      Assessment/ Plan:   Active Hospital Problems    Diagnosis    Primary Problem: Altered mental status    Hypokalemia    Seizure (HCC)    Hypophosphatemia    Dementia     Chella Scottis a 83 y.o.femalewho presents with seizure like activity confirmed by EEG and UTI, intubated due to hypoxia. She is clinically stable. EEG cannot rule out seizure at this time. NCCU following. Spoke to daughter, patient has severe depression and dementia. Consider Psych consult when extubated/out of ICU.     Seizure  -Received load dose of Keppra in ED. Continue 500 mg BID  -Seizure precautions, neuro checks   -NCCU following  -Routine EEG could not rule out seizure, extended study  -MRI demonstrated concern for subdural empyema  - Neurology to continue to follow    Possible Subdural Empyema  - NSGY consultation, Neurology consultation  - ID consultation  - Following recs  - Also obtaining MRI L spine and EMG for lower extremity weakness    Pneumonia // Hypoxic respiratory failure  - On 4LPM NC  -no leukocytosis, outside facility procal negative  -CXR showed lingual consolidation, possible PNA  -clinically stable  -Vanc/Meropenem May have aspirated with seizure  -Procal WNL  -CRP WNL  -urine strep/legionella negative  -blood cultures NGTD  -Flu/biofire negative  -BAL - cell count and culture - NGTD, PMNS on stain  -Continue to wean vent    UTI  -UA showed moderate leuks, many bacteria.  -unclear contribution to AMS  -Clinically stable  -Continue broad spectrum abx as above    Elevated INR  -likely 2/2 to poor nutrition  -Consider Vitamin K replacement     AMS  -hx of dementia/Schizophrenia   -baseline from report is averbal 2/2 to psychiatric disease with contributing dementia  -UTI in this setting may also reduce baseline mentation  -B12/folate/TSH WNL  -urine drug screen showed buprenorphine. May be  false    Hypophosphatemia, resolved    Margit Hanks, MD

## 2016-09-26 LAB — CBC WITH DIFF
BASOPHIL #: 0.05 x10ˆ3/uL (ref 0.00–0.20)
BASOPHIL %: 1 %
EOSINOPHIL #: 0.15 x10ˆ3/uL (ref 0.00–0.50)
EOSINOPHIL %: 3 %
HCT: 33.7 % (ref 33.5–45.2)
HGB: 11.2 g/dL (ref 11.2–15.2)
LYMPHOCYTE #: 1.99 x10ˆ3/uL (ref 1.00–4.80)
LYMPHOCYTE %: 34 %
MCH: 30.7 pg (ref 27.4–33.0)
MCH: 30.7 pg (ref 27.4–33.0)
MCHC: 33.2 g/dL (ref 32.5–35.8)
MCV: 92.4 fL (ref 78.0–100.0)
MONOCYTE #: 0.7 x10ˆ3/uL (ref 0.30–1.00)
MONOCYTE %: 12 %
MPV: 6.9 fL — ABNORMAL LOW (ref 7.5–11.5)
NEUTROPHIL #: 2.94 10*3/uL (ref 1.50–7.70)
NEUTROPHIL #: 2.94 x10ˆ3/uL (ref 1.50–7.70)
NEUTROPHIL %: 51 %
NEUTROPHIL %: 51 %
PLATELETS: 270 x10ˆ3/uL (ref 140–450)
RBC: 3.65 10*6/uL (ref 3.63–4.92)
RBC: 3.65 x10ˆ6/uL (ref 3.63–4.92)
RDW: 15.8 % — ABNORMAL HIGH (ref 12.0–15.0)
WBC: 5.8 x10ˆ3/uL (ref 3.5–11.0)

## 2016-09-26 LAB — ADULT ROUTINE BLOOD CULTURE, SET OF 2 BOTTLES (BACTERIA AND YEAST)
BLOOD CULTURE, ROUTINE: NO GROWTH
BLOOD CULTURE, ROUTINE: NO GROWTH
BLOOD CULTURE, ROUTINE: NO GROWTH
BLOOD CULTURE, ROUTINE: NO GROWTH
BLOOD CULTURE, ROUTINE: NO GROWTH

## 2016-09-26 LAB — BASIC METABOLIC PANEL
ANION GAP: 7 mmol/L (ref 4–13)
BUN/CREA RATIO: 4 — ABNORMAL LOW (ref 6–22)
BUN: 2 mg/dL — ABNORMAL LOW (ref 8–25)
CALCIUM: 8.8 mg/dL (ref 8.5–10.2)
CALCIUM: 8.8 mg/dL (ref 8.5–10.2)
CHLORIDE: 104 mmol/L (ref 96–111)
CO2 TOTAL: 30 mmol/L (ref 22–32)
CREATININE: 0.5 mg/dL (ref 0.49–1.10)
CREATININE: 0.5 mg/dL (ref 0.49–1.10)
ESTIMATED GFR: >59 mL/min/1.73mˆ2 (ref >59)
GLUCOSE: 77 mg/dL (ref 65–139)
POTASSIUM: 3 mmol/L — ABNORMAL LOW (ref 3.5–5.1)
SODIUM: 141 mmol/L (ref 136–145)

## 2016-09-26 LAB — MAGNESIUM: MAGNESIUM: 1.8 mg/dL (ref 1.6–2.5)

## 2016-09-26 LAB — PHOSPHORUS: PHOSPHORUS: 1.7 mg/dL — ABNORMAL LOW (ref 2.3–4.0)

## 2016-09-26 MED ADMIN — heparin (porcine) 5,000 unit/mL injection solution: SUBCUTANEOUS | @ 05:00:00

## 2016-09-26 NOTE — Care Plan (Signed)
Problem: Patient Care Overview (Adult,OB)  Goal: Plan of Care Review(Adult,OB)  The patient and/or their representative will communicate an understanding of their plan of care   Outcome: Ongoing (see interventions/notes)  POC reviewed with patient. Discharge planning in progress. Tolerating diet. Assessment per flow sheet.

## 2016-09-26 NOTE — Progress Notes (Signed)
[facility]  Medicine Progress Note    [name]  Date of service: 09/26/2016  Date of Admission:  09/21/2016    Hospital Day:  LOS: 5 days   Subjective: Pt seen and examined at the bedside.  No overnight events.  Pt was lying in the bed comfortably. Denies any chest pain, sob, nausea, vomiting and diarrhea. Appears to be confused.           Vital Signs:  Temp  Avg: 36.9 C (98.4 F)  Min: 36.7 C (98.1 F)  Max: 37.1 C (98.8 F)    Pulse  Avg: 69.8  Min: 60  Max: 77 BP  Min: 106/76  Max: 158/69   Resp  Avg: 20.9  Min: 18  Max: 26 SpO2  Avg: 96.5 %  Min: 93 %  Max: 98 %   Pain Score (Numeric, Faces): 0      Input/Output    Intake/Output Summary (Last 24 hours) at 09/26/16 1300  Last data filed at 09/26/16 1004   Gross per 24 hour   Intake              840 ml   Output                0 ml   Net              840 ml    I/O last shift:  01/17 0800 - 01/17 1559  In: 600 [P.O.:600]  Out: -        Current Facility-Administered Medications:  diazePAM (VALIUM) tablet 5 mg Oral Q8H   famotidine (PEPCID) 40mg  per 5mL oral liquid 20 mg Oral Daily   heparin 5,000 unit/mL injection 5,000 Units Subcutaneous Q8HRS   lanolin-oxyquin-pet, hydrophil (BAG BALM) topical ointment  Apply Topically 2x/day PRN   levETIRAcetam (KEPPRA) tablet 500 mg Oral 2x/day   NS flush syringe 2 mL Intracatheter Q8HRS   And      NS flush syringe 2-6 mL Intracatheter Q1 MIN PRN   polyethylene glycol (MIRALAX) oral packet 17 g Oral Daily   senna concentrate (SENNA) 528mg  per 15mL oral liquid 10 mL Oral 2x/day     Physical Exam:   General:Cooperative female in no acute distress.   Lungs: Clear to auscultation bilaterally. No crackles, rales or wheezing   Cardiovascular: regular rate and rhythm, S1, S2 normal, no murmur, click, rub or gallop   Abdomen: Soft, non-tender, Bowel sounds normal, non-distended, No hepatosplenomegaly   Extremities: No cyanosis or edema  Neurologic: Awake, alert, oriented X2( place, person)        Labs:    CBC:  Recent Labs       09/24/16   0413  09/25/16   0716  09/26/16   0453   WBC  7.8  6.0  5.8   HGB  10.9*  11.8  11.2   HCT  33.0*  34.2  33.7   PLTCNT  250  246  270    RBC Indices:  Recent Labs      09/24/16   0413  09/25/16   0716  09/26/16   0453   MCV  93.6  94.9  92.4   MCH  30.9  32.7  30.7   MCHC  33.1  34.5  33.2   RDW  15.7*  15.9*  15.8*   MPV  7.2*  7.2*  6.9*      Differential:   Recent Labs      09/24/16   0413  09/25/16   0716  09/26/16   0453   PMNS  61  49  51   LYMPHOCYTES  24  36  34   MONOCYTES  11  12  12    EOSINOPHIL  3  3  3    BASOPHILS  1  0.06  1  0.04  1  0.05   PMNABS  4.79  2.97  2.94   LYMPHSABS  1.85  2.16  1.99   MONOSABS  0.88  0.71  0.70   EOSABS  0.25  0.16  0.15     BMP:  Recent Labs      09/24/16   0413  09/25/16   0716  09/26/16   0453   SODIUM  142  141  141   POTASSIUM  3.0*  3.5  3.0*   CHLORIDE  108  106  104   CO2  26  28  30    BUN  2*  2*  2*   CREATININE  0.53  0.52  0.50   GFR  >59  >59  >59   ANIONGAP  8  7  7    GLUCOSENF  98  91  77   CALCIUM  8.7  8.9  8.8   MAGNESIUM  1.5*  1.8  1.8   PHOSPHORUS  1.9*  1.4*  1.7*     Liver/Pancreas:  No results for input(s): TOTALPROTEIN, ALBUMIN, PREALBUMIN, AST, ALT, ALKPHOS, LDH, AMYLASE, LIPASE, TOTBILIRUBIN, BILIRUBINCON in the last 72 hours.    Invalid input(s): GGT  Cardiac Markers:  Recent Labs      09/24/16   0413   TROPONINI  9     Coagulation Studies:  No results for input(s): INR, PROTHROMTME, APTT in the last 72 hours.    Invalid input(s): PTT, PT, CREACTPROTIE  ABG's  No results found for this encounter    Results for orders placed or performed during the hospital encounter of 09/21/16 (from the past 72 hour(s))   CT BRAIN WO IV CONTRAST     Status: None    Narrative    [name]  Female, 83 years old.    CT BRAIN WO IV CONTRAST performed on 09/23/2016 8:33 PM.    REASON FOR EXAM:  subdural hematoma vs empyema      TECHNIQUE: 5mm axial images of the brain from the vertex to the skull base  with reformatted coronal and sagittal images  were obtained and reviewed in  bone, soft tissue, and stroke windows.     COMPARISON: MRI brain dated September 22, 2016.    FINDINGS: A small left occipital convexity subdural fluid collection is  better visualized on prior MRI. No new extra-axial fluid collection or  intracranial hemorrhage is identified. No mass effect, midline shift, or  hydrocephalus is present. A focal area of encephalomalacia in the superior  right cerebellum is consistent with a remote infarct. Gray-white interface  is otherwise preserved. Generalized volume loss is noted in the form of  prominent ventricles and sulci. The skull and scalp are within normal  limits. Trace secretions are present within the left frontal sinus. The  visualized remaining paranasal sinuses are well-aerated. Basal ganglia  calcifications are present bilaterally.      Impression    Small left occipital convexity subdural fluid collection is better  visualized on MRI. Noncontrast CT cannot differentiate between empyema and  subdural hematoma. No new intracranial abnormality.     MRI SPINE LUMBOSACRAL W/WO CONTRAST     Status:  None    Narrative    [name]  Female, 83 years old.    MRI SPINE LUMBOSACRAL W/WO CONTRAST performed on 09/25/2016 1:48 AM.    REASON FOR EXAM:  assess for stenosis, abscess      INTRAVENOUS CONTRAST: 18 ml's of Multihance  CREATININE/GFR: 0.53 mg/dL on 1/61/0960 AVW>09    COMPARISON: None    TECHNIQUE: Coronal STIR, Sagittal T1-, T2-, and axial T1- and T2-w images  of the lumbar spine. Postcontrast fat saturated sagittal T1 and axial T1  images were performed.    FINDINGS: There is anterolisthesis of L3 relative to L4 and L4 relative to  L5. These are degenerative in nature. No definite fractures are  appreciated. Advanced facet hypertrophic changes are noted at the L3-L4,  L4-5 and L5-S1 levels along with ligamentum flavum hypertrophy.    Evaluation of the individual levels demonstrates:    T12-L1: Unremarkable.    L1-2: Unremarkable.    L2-3:  Mild broad-based disc bulging with mild facet hypertrophy with mild  spinal canal and mild foraminal stenosis.    L3-4: There is a broad-based disc bulging along with moderate facet  hypertrophy with moderate foraminal stenosis and mild spinal canal  stenosis.    L4-5: moderate degree of disc bulging along with facet hypertrophy and  ligamentum flavum hypertrophy with mild to moderate spinal canal stenosis  and moderate foraminal stenosis.    L5-S1: Loss of disc height, asymmetric to the left associated with endplate  edema along with moderate facet hypertrophy with moderate bilateral  foraminal stenosis, and mild spinal canal stenosis.    There is a large 8 x 7 x 9 cm left adrenal mass lesion that inferiorly  displaces the left kidney that is at least partially enhancing and can be  completely evaluated with a CT adrenal protocol.      Impression    1. Mild to moderate degenerative changes of the lumbar spine worse at the  L3 through the S1 level with up to moderate foraminal stenosis.Marland Kitchen  2. An incompletely evaluated large 8 x 7 x 9 cm left adrenal mass lesion  that inferiorly displaces the left kidney that is at least partially  enhancing and can be completely evaluated with a CT adrenal protocol.           PT/OT: Yes    Consults: NSGY, neurology, Cardiology (to be called)      Assessment/ Plan:   Active Hospital Problems    Diagnosis   . Primary Problem: Altered mental status   . Hypokalemia   . Seizure (HCC)   . Hypophosphatemia   . Dementia      83 y.o.femalewho presents with seizure like activity confirmed by EEG and UTI, intubated due to hypoxia.     Seizure  -Continue Keppra  500 mg BID  -Seizure precautions, neuro checks   - Neurology to continue to follow      Possible Subdural Empyema  - NSGY consulted, recommended medical management and outpatient follow up   - ID consulted, recommended it is unlikely empyema, so no need for antibiotics. Antibiotics stopped yesterday   - MRI L spine of lower extremity  weakness. Mild to moderate degenerative changes of the lumbar spine worse at the  L3 through the S1 level with up to moderate foraminal stenosis..      Pneumonia // Hypoxic respiratory failure- Resolved         # ESBL UTI  UA showed moderate leuks, many bacteria. Urine culture grew  ESBL.  - Pt completed the course of antibiotics.      Elevated INR  -likely 2/2 to poor nutrition  -Consider Vitamin K replacement     AMS  -hx of dementia/Schizophrenia   -baseline from report is averbal 2/2 to psychiatric disease with contributing dementia  -UTI in this setting may also reduce baseline mentation  -B12/folate/TSH WNL    Hypophosphatemia, resolved    Jaynee EaglesAbdul Jadan Rouillard, MD

## 2016-09-27 LAB — CBC WITH DIFF
BASOPHIL #: 0.05 x10ˆ3/uL (ref 0.00–0.20)
BASOPHIL %: 1 %
EOSINOPHIL #: 0.14 x10ˆ3/uL (ref 0.00–0.50)
EOSINOPHIL %: 2 %
HCT: 33.3 % — ABNORMAL LOW (ref 33.5–45.2)
HGB: 11.2 g/dL (ref 11.2–15.2)
LYMPHOCYTE #: 2.07 x10ˆ3/uL (ref 1.00–4.80)
LYMPHOCYTE %: 33 %
MCH: 30.8 pg (ref 27.4–33.0)
MCHC: 33.6 g/dL (ref 32.5–35.8)
MCV: 91.7 fL (ref 78.0–100.0)
MCV: 91.7 fL (ref 78.0–100.0)
MONOCYTE #: 0.73 x10ˆ3/uL (ref 0.30–1.00)
MONOCYTE %: 12 %
MPV: 7 fL — ABNORMAL LOW (ref 7.5–11.5)
NEUTROPHIL #: 3.22 x10ˆ3/uL (ref 1.50–7.70)
NEUTROPHIL %: 52 %
PLATELETS: 261 x10ˆ3/uL (ref 140–450)
RBC: 3.63 10*6/uL (ref 3.63–4.92)
RBC: 3.63 x10ˆ6/uL (ref 3.63–4.92)
RDW: 15.7 % — ABNORMAL HIGH (ref 12.0–15.0)
WBC: 6.2 x10ˆ3/uL (ref 3.5–11.0)

## 2016-09-27 LAB — BASIC METABOLIC PANEL
ANION GAP: 8 mmol/L (ref 4–13)
BUN/CREA RATIO: 8 (ref 6–22)
BUN: 4 mg/dL — ABNORMAL LOW (ref 8–25)
CALCIUM: 8.7 mg/dL (ref 8.5–10.2)
CHLORIDE: 105 mmol/L (ref 96–111)
CO2 TOTAL: 30 mmol/L (ref 22–32)
CREATININE: 0.52 mg/dL (ref 0.49–1.10)
ESTIMATED GFR: >59 mL/min/1.73mˆ2 (ref >59)
GLUCOSE: 89 mg/dL (ref 65–139)
POTASSIUM: 3 mmol/L — ABNORMAL LOW (ref 3.5–5.1)
SODIUM: 143 mmol/L (ref 136–145)

## 2016-09-27 LAB — PT/INR
INR: 1.1 (ref 0.80–1.20)
PROTHROMBIN TIME: 12.8 s (ref 9.3–13.9)
PROTHROMBIN TIME: 12.8 seconds (ref 9.3–13.9)

## 2016-09-27 LAB — PHOSPHORUS: PHOSPHORUS: 2 mg/dL — ABNORMAL LOW (ref 2.3–4.0)

## 2016-09-27 LAB — MAGNESIUM: MAGNESIUM: 1.6 mg/dL (ref 1.6–2.5)

## 2016-09-27 MED ORDER — LANOLIN-OXYQUIN-PET, HYDROPHIL TOPICAL OINTMENT: Freq: Two times a day (BID) | CUTANEOUS | 0 refills | 0 days | Status: AC | PRN

## 2016-09-27 MED ORDER — POTASSIUM CHLORIDE ER 20 MEQ TABLET,EXTENDED RELEASE(PART/CRYST)
40.0000 meq | ORAL_TABLET | Freq: Once | ORAL | Status: AC
Start: 2016-09-27 — End: 2016-09-27
  Administered 2016-09-27: 40 meq via ORAL
  Filled 2016-09-27: qty 2

## 2016-09-27 MED ORDER — DIAZEPAM 5 MG TABLET: 5 mg | Freq: Three times a day (TID) | ORAL | 0 refills | 0 days | Status: AC

## 2016-09-27 MED ORDER — LEVETIRACETAM 500 MG TABLET: 500 mg | Freq: Two times a day (BID) | ORAL | 0 refills | 0 days | Status: AC

## 2016-09-27 MED ADMIN — docusate sodium 100 mg capsule: SUBCUTANEOUS | @ 05:00:00

## 2016-09-27 MED ADMIN — diazePAM 5 mg tablet: @ 13:00:00

## 2016-09-27 MED ADMIN — electrolyte-A intravenous solution: @ 14:00:00

## 2016-09-27 MED ADMIN — PEDS CUSTOM PARENTERAL NUTRITION - 6 MONTHS TO 11 YEARS OLD: ORAL | @ 10:00:00

## 2016-09-27 NOTE — Discharge Summary (Signed)
DISCHARGE SUMMARY      PATIENT NAMELashea, [name]  MRN:  [id number]  DOB:  [birth year]    ADMISSION DATE:  09/21/2016  DISCHARGE DATE:  09/27/2016    ATTENDING PHYSICIAN: Jaynee Eagles, MD  SERVICE: MED HOSPITALIST 5  PRIMARY CARE PHYSICIAN: Pcp Not In System     Reason for Admission     Diagnosis    Altered mental status [118920]          DISCHARGE DIAGNOSIS:     Principal Problem:  Altered mental status    Active Hospital Problems    Diagnosis Date Noted   . Principle Problem: Altered mental status 09/21/2016   . Hypokalemia 09/24/2016   . Seizure (HCC) 09/21/2016   . Hypophosphatemia 09/21/2016   . Dementia 09/21/2016      Resolved Hospital Problems    Diagnosis    No resolved problems to display.     Active Non-Hospital Problems    Diagnosis Date Noted   . COPD (chronic obstructive pulmonary disease) (HCC) 09/21/2016   . Schizophrenia (HCC) 09/21/2016   . HTN (hypertension) 09/21/2016      Allergies   Allergen Reactions   . Sulfa (Sulfonamides)    . Bactrim [Sulfamethoxazole-Trimethoprim] Swelling   . Penicillins Swelling            DISCHARGE MEDICATIONS:     Current Discharge Medication List      START taking these medications.       Details    diazePAM 5 mg Tablet   Commonly known as:  VALIUM    5 mg, Oral, Q8H   Refills:  0       lanolin-oxyquin-pet, hydrophil Ointment   Commonly known as:  BAG BALM    Apply Topically, 2x/day PRN   Refills:  0       levETIRAcetam 500 mg Tablet   Commonly known as:  KEPPRA    500 mg, Oral, 2x/day   Refills:  0         CONTINUE these medications - NO CHANGES were made during your visit.       Details    pantoprazole 20 mg Tablet, Delayed Release (E.C.)   Commonly known as:  PROTONIX    20 mg, Oral, Daily before Breakfast   Refills:  0         STOP taking these medications.          ciprofloxacin HCl 500 mg Tablet   Commonly known as:  CIPRO       levoFLOXacin 500 mg Tablet   Commonly known as:  LEVAQUIN           Discharge med list refreshed?  NO    During this hospitalization did the  patient have an AMI, PCI/PCTA, STENT or Isolated CABG?  No                DISCHARGE INSTRUCTIONS:  Follow-up Information     Follow up with Neurosurgery Clinic, [facility] .    Specialty:  Neurosurgery    Contact information:    [address] [facility] [address]  [place] [address]  [phone]    Additional information:    For driving directions to the Neurosurgery Clinic, located within the [facility], [place], please call [phone]-CARE ([phone]) or you may visit our website at [email or web address]*Valet parking is available to patients at [facility] outpatient clinics for free and tipping is not required.*Visitors to our main campus will Radio producer as we are  expanding to better serve you.  We apologize for any inconvenience this may cause and appreciate your patience.          Follow up with Neurology Clinic, [facility] .    Specialty:  Neurology    Contact information:    [address] [facility] [address]  [place] [address]  [phone]    Additional information:    For driving directions to the Neurology Clinic, located within the [facility] [place],  please call [phone]-CARE [phone]) or you may visit our website at [email or web address]*Valet parking is available to patients at [facility] outpatient clinics for free and tipping is not required.*Visitors to our main campus will Radio producer as we are expanding to better serve you. We apologize for any inconvenience this may cause and appreciate your patience.          CT BRAIN W/WO IV CONTRAST   Isolation Status:  Contact  Prior to [facility] appt.   Ordering Provider ONEDIA, VARGUS [[id number]]    What is the patient's weight in lbs? 88.5 kg (195 lb) (09/24/2016)    Is the patient allergic to IV contrast? No      SCHEDULE FOLLOW-UP NEUROSURGERY - PHYSICIAN OFFICE CENTER   Follow-up in: 2 WEEKS    Reason for visit: HOSPITAL DISCHARGE    Follow-up reason: subdural empyema     Coordination with other outpt. appts.? YES (provide details in comment section & place separate orders for those items) CT brain with and without    Provider: Rod Holler      SCHEDULE FOLLOW-UP NEUROLOGY - PHYSICIAN OFFICE CENTER   Follow-up in: 3 MONTHS    Reason for visit: HOSPITAL DISCHARGE    Provider: Dr. Christell Constant             REASON FOR HOSPITALIZATION AND HOSPITAL COURSE:  This is a 83 y.o., female 83 year old female with a past medical history of COPD, dementia, hypertension, schizophrenia and cerebral vascular accident who currently presented as a transfer from [facility] to [facility] on September 21, 2016, due to new onset of seizures as well as hypoxic respiratory failure requiring intubation.  MRI of the brain with and without contrast performed September 22, 2016, revealed a small left occipital convexity and subdural fluid collection suspicious for an empyema vs  hematoma    Seizure  -Continue Keppra  500 mg BID  -Seizure precautions  - Neurology to continue to follow  - c/w Diazepam( per daughter, she has been on diazepam for sometime)      Possible Subdural Empyema vs hematoma   - NSGY consulted, recommended medical management and outpatient follow up   - ID consulted, recommended it is unlikely empyema, so no need for antibiotics. Antibiotics stopped on 1/16     Pneumonia // Hypoxic respiratory failure- Resolved     # Lower extremity weakness:  Per daughter, pt has h/o lower extremity weakness.   Will continue to monitor.     # ESBL UTI  UA showed moderate leuks, many bacteria. Urine culture grew ESBL.  - Pt completed the course of antibiotics.      Elevated INR- Resolved       AMS  -hx of dementia/Schizophrenia   -baseline from report is averbal 2/2 to psychiatric disease with contributing dementia  -B12/folate/TSH WNL    Hypophosphatemia, resolved        CONDITION ON DISCHARGE:  A. Ambulation: Up with assistance only  B. Self-care Ability: With full assistance  C. Cognitive  Status  Oriented to person  D. DNR status at discharge: Full Code    Advance Directive Information       Most Recent Value    Does the Patient have an Advance Directive? Yes, Patient Does Have Advance Directive for Healthcare Treatment    Type of Advance Directive Completed Medical Power of Attorney    Copy of Advance Directives in Chart? No, Copy Requested From Family    Name of MPOA or Healthcare Surrogate Melvenia NeedlesJoyce Regas-daughter    Phone Number of MPOA or Healthcare Surrogate [phone]          DISCHARGE DISPOSITION:  Skilled Nursing Unit          Pending/Ordered Tests/Procedures:  NO    Pending/Ordered Referrals/Follow-up:  NO    Jaynee EaglesAbdul Foye Haggart, MD      Copies sent to Care Team       Relationship Specialty Notifications Start End    System, Pcp Not In PCP - General   09/24/16           Referring providers can utilize [email or web address] to access their referred [facility] patient's information.

## 2016-09-27 NOTE — Care Management Notes (Signed)
[facility]  Care Management Note    Patient Name: [name]  Date of Birth: 06/15/1934  Sex: female  Date/Time of Admission: 09/21/2016  5:21 AM  Room/Bed: 768/A  Payor: MEDICARE / Plan: MEDICARE PART A AND B / Product Type: Medicare /    LOS: 6 days   PCP: Pcp Not In System    Admitting Diagnosis:  Altered mental status [R41.82]    Assessment:      09/27/16 1412   Assessment Details   Assessment Type Continued Assessment   Date of Care Management Update 09/27/16   Date of Next DCP Update 09/28/16   Care Management Plan   Discharge Planning Status discharge plan complete   Projected Discharge Date 09/27/16   Discharge Needs Assessment   Discharge Facility/Level Of Care Needs SNF Return (Medicare certified)(code 3)   Per TBR, pt is d/c ready today.  I spoke to pts dgt via phone.  She confirms that pt is to return to [facility] nursing facility.  Per Leory PlowmanSue Gage of [facility], bed is available for pt today at [facility].  Arranged for ambulance today for 2pm.  AVS updated with number for report.  Nurse aware of plan.      Discharge Plan:  SNF Return (Medicare certified) (code 3)      The patient will continue to be evaluated for developing discharge needs.     Case Manager: Deloria LairPaul Fitzgerald Cordarius Benning, SOCIAL WORKER  Phone: [phone]

## 2016-09-27 NOTE — Discharge Instructions (Signed)
Discharge Recommendations/ Plan:Discharge to:SNF Return (Medicare certified) (code 3)      Resources: Please call report to [facility].  [phone]

## 2016-09-27 NOTE — Nurses Notes (Signed)
Report called to [facility]. Will continue to monitor.

## 2016-09-27 NOTE — Progress Notes (Signed)
[facility]  Medicine Progress Note    [name]  Date of service: 09/27/2016  Date of Admission:  09/21/2016    Hospital Day:  LOS: 6 days   Subjective: Pt seen and examined at the bedside.  No overnight events.  Pt was sitting in the bed comfortably. Pt reports that she is not able to move her lower extremities. She is speaking in full sentences. Responding the questions appropriately.       Vital Signs:  Temp  Avg: 36.9 C (98.5 F)  Min: 36.6 C (97.9 F)  Max: 37.2 C (99 F)    Pulse  Avg: 67  Min: 62  Max: 73 BP  Min: 123/66  Max: 155/49   Resp  Avg: 19.2  Min: 14  Max: 25 SpO2  Avg: 96.2 %  Min: 90 %  Max: 100 %   Pain Score (Numeric, Faces): 0      Input/Output    Intake/Output Summary (Last 24 hours) at 09/27/16 1132  Last data filed at 09/27/16 8119   Gross per 24 hour   Intake             1140 ml   Output                0 ml   Net             1140 ml    I/O last shift:  01/18 0800 - 01/18 1559  In: 600 [P.O.:600]  Out: -        Current Facility-Administered Medications:  diazePAM (VALIUM) tablet 5 mg Oral Q8H   famotidine (PEPCID) 40mg  per 5mL oral liquid 20 mg Oral Daily   heparin 5,000 unit/mL injection 5,000 Units Subcutaneous Q8HRS   lanolin-oxyquin-pet, hydrophil (BAG BALM) topical ointment  Apply Topically 2x/day PRN   levETIRAcetam (KEPPRA) tablet 500 mg Oral 2x/day   NS flush syringe 2 mL Intracatheter Q8HRS   And      NS flush syringe 2-6 mL Intracatheter Q1 MIN PRN   polyethylene glycol (MIRALAX) oral packet 17 g Oral Daily   senna concentrate (SENNA) 528mg  per 15mL oral liquid 10 mL Oral 2x/day     Physical Exam:   General:Cooperative female in no acute distress.   Lungs: Clear to auscultation bilaterally. No crackles, rales or wheezing   Cardiovascular: regular rate and rhythm, S1, S2 normal, no murmur, click, rub or gallop   Abdomen: Soft, non-tender, Bowel sounds normal, non-distended, No hepatosplenomegaly   Extremities: No cyanosis or edema  Neurologic: Awake, alert, oriented  X2-3( place, person),  Strength: Lower extremity: 0/5 Upper extremity: LEFT arm: 4/5 rt arm: 4/5         Labs:    CBC:  Recent Labs      09/25/16   0716  09/26/16   0453  09/27/16   0432   WBC  6.0  5.8  6.2   HGB  11.8  11.2  11.2   HCT  34.2  33.7  33.3*   PLTCNT  246  270  261    RBC Indices:  Recent Labs      09/25/16   0716  09/26/16   0453  09/27/16   0432   MCV  94.9  92.4  91.7   MCH  32.7  30.7  30.8   MCHC  34.5  33.2  33.6   RDW  15.9*  15.8*  15.7*   MPV  7.2*  6.9*  7.0*  Differential:   Recent Labs      09/25/16   0716  09/26/16   0453  09/27/16   0432   PMNS  49  51  52   LYMPHOCYTES  36  34  33   MONOCYTES  12  12  12    EOSINOPHIL  3  3  2    BASOPHILS  1   0.04  1   0.05  1   0.05   PMNABS  2.97  2.94  3.22   LYMPHSABS  2.16  1.99  2.07   MONOSABS  0.71  0.70  0.73   EOSABS  0.16  0.15  0.14     BMP:  Recent Labs      09/25/16   0716  09/26/16   0453  09/27/16   0432   SODIUM  141  141  143   POTASSIUM  3.5  3.0*  3.0*   CHLORIDE  106  104  105   CO2  28  30  30    BUN  2*  2*  4*   CREATININE  0.52  0.50  0.52   GFR  >59  >59  >59   ANIONGAP  7  7  8    GLUCOSENF  91  77  89   CALCIUM  8.9  8.8  8.7   MAGNESIUM  1.8  1.8  1.6   PHOSPHORUS  1.4*  1.7*  2.0*     Liver/Pancreas:  No results for input(s): TOTALPROTEIN, ALBUMIN, PREALBUMIN, AST, ALT, ALKPHOS, LDH, AMYLASE, LIPASE, TOTBILIRUBIN, BILIRUBINCON in the last 72 hours.    Invalid input(s): GGT  Cardiac Markers:  No results for input(s): TROPONINI, CKMB, MBINDEX, BNP in the last 72 hours.  Coagulation Studies:  Recent Labs      09/27/16   0432   INR  1.10   PROTHROMTME  12.8     ABG's  No results found for this encounter    Results for orders placed or performed during the hospital encounter of 09/21/16 (from the past 72 hour(s))   MRI SPINE LUMBOSACRAL W/WO CONTRAST     Status: None    Narrative    [name]  Female, 83 years old.    MRI SPINE LUMBOSACRAL W/WO CONTRAST performed on 09/25/2016 1:48 AM.    REASON FOR EXAM:  assess for stenosis,  abscess      INTRAVENOUS CONTRAST: 18 ml's of Multihance  CREATININE/GFR: 0.53 mg/dL on 1/61/09601/15/2018 AVW>09GFR>59    COMPARISON: None    TECHNIQUE: Coronal STIR, Sagittal T1-, T2-, and axial T1- and T2-w images  of the lumbar spine. Postcontrast fat saturated sagittal T1 and axial T1  images were performed.    FINDINGS: There is anterolisthesis of L3 relative to L4 and L4 relative to  L5. These are degenerative in nature. No definite fractures are  appreciated. Advanced facet hypertrophic changes are noted at the L3-L4,  L4-5 and L5-S1 levels along with ligamentum flavum hypertrophy.    Evaluation of the individual levels demonstrates:    T12-L1: Unremarkable.    L1-2: Unremarkable.    L2-3: Mild broad-based disc bulging with mild facet hypertrophy with mild  spinal canal and mild foraminal stenosis.    L3-4: There is a broad-based disc bulging along with moderate facet  hypertrophy with moderate foraminal stenosis and mild spinal canal  stenosis.    L4-5: moderate degree of disc bulging along with facet hypertrophy and  ligamentum flavum hypertrophy with mild to moderate spinal canal  stenosis  and moderate foraminal stenosis.    L5-S1: Loss of disc height, asymmetric to the left associated with endplate  edema along with moderate facet hypertrophy with moderate bilateral  foraminal stenosis, and mild spinal canal stenosis.    There is a large 8 x 7 x 9 cm left adrenal mass lesion that inferiorly  displaces the left kidney that is at least partially enhancing and can be  completely evaluated with a CT adrenal protocol.      Impression    1. Mild to moderate degenerative changes of the lumbar spine worse at the  L3 through the S1 level with up to moderate foraminal stenosis.Marland Kitchen  2. An incompletely evaluated large 8 x 7 x 9 cm left adrenal mass lesion  that inferiorly displaces the left kidney that is at least partially  enhancing and can be completely evaluated with a CT adrenal protocol.           PT/OT: Yes    Consults: NSGY,  neurology, Cardiology (to be called)      Assessment/ Plan:   Active Hospital Problems    Diagnosis    Primary Problem: Altered mental status    Hypokalemia    Seizure (HCC)    Hypophosphatemia    Dementia     83 year old female with a past medical history of COPD, dementia, hypertension, schizophrenia and cerebral vascular accident who currently presented as a transfer from [facility] to [facility] on September 21, 2016, due to new onset of seizures as well as hypoxic respiratory failure requiring intubation.  MRI of the brain with and without contrast performed September 22, 2016, revealed a small left occipital convexity and subdural fluid collection suspicious for an empyema vs  hematoma    Seizure  -Continue Keppra  500 mg BID  -Seizure precautions  - Neurology to continue to follow      Possible Subdural Empyema vs hematoma   - NSGY consulted, recommended medical management and outpatient follow up   - ID consulted, recommended it is unlikely empyema, so no need for antibiotics. Antibiotics stopped on 1/16     Pneumonia // Hypoxic respiratory failure- Resolved     # Lower extremity weakness:  Per daughter, pt has h/o lower extremity weakness.   Will continue to monitor.     # ESBL UTI  UA showed moderate leuks, many bacteria. Urine culture grew ESBL.  - Pt completed the course of antibiotics.      Elevated INR- Resolved       AMS  -hx of dementia/Schizophrenia   -baseline from report is averbal 2/2 to psychiatric disease with contributing dementia  -UTI in this setting may also reduce baseline mentation  -B12/folate/TSH WNL    Hypophosphatemia, resolved    Jaynee Eagles, MD

## 2016-09-27 NOTE — Nurses Notes (Signed)
Pt has Valium in medication list on AVS. No hard copy prescription available. Dr. Silvestre GunnerMannan paged. MD states pt has been getting this medication while at [facility] prior to hospital admission and spoke to her daughter about dosage. MD states pt does not need a new prescription written. Will continue to monitor.

## 2016-09-27 NOTE — Nurses Notes (Signed)
Dr. Silvestre GunnerMannan at bedside to assess pt. Informed physician that pt is still bedrest and that physical therapy and occupational therapy have not been in to see pt and that there is no consult in. Physician states that orders could be placed for PT/OT consults and that pt could no longer be bedrest. Will continue to monitor.

## 2016-09-27 NOTE — Nurses Notes (Signed)
Pt turned in pt. Lying on left side. Will continue to monitor.

## 2016-09-28 NOTE — Care Management Notes (Signed)
Referral Information  ++++++ Placed Provider #1 ++++++  Case Manager: Daniel Johnston  Provider Type: Nursing Home/SNF  Provider Name: [facility]  Address:  [address]  [place],  [address]  Contact: Burklow Brooks    Phone: [phone] x  Fax:   Fax: [phone]

## 2016-10-04 NOTE — ED Attending Note (Signed)
ADDENDUM:    I performed a history and physical examination of the patient and discussed his/her management with the resident.  I reviewed the resident's note and agree with the documented findings and plan of care with the following additions / exceptions:  none    Ezzie Duralharles R Keshan Reha, MD  10/04/2016, 02:04    Disposition: Admitted      Clinical Impression:     Encounter Diagnoses   Name Primary?    Altered mental status, unspecified altered mental status type Yes    Sepsis, due to unspecified organism (HCC)     Subdural empyema

## 2016-10-18 ENCOUNTER — Ambulatory Visit (HOSPITAL_COMMUNITY): Payer: Medicare Other

## 2016-10-18 ENCOUNTER — Encounter (INDEPENDENT_AMBULATORY_CARE_PROVIDER_SITE_OTHER): Payer: Self-pay | Admitting: Neurological Surgery

## 2016-10-19 ENCOUNTER — Other Ambulatory Visit (HOSPITAL_COMMUNITY): Payer: Self-pay

## 2016-12-17 NOTE — Care Management Notes (Addendum)
Care Coordinator/Social Work Plan  [place]  Patient Name: [name] . FINFROCK   MRN: [id number]   Acct Number: [id number]  DOB: 1934/05/05 Age: 83  **Admission Information**  Patient Type: INPATIENT  Admit Date: 09/21/2016 Admit Time: 05:21  Admit Reason: Altered mental status, unspecified  Admitting Phys: Steele Berg  Attending Phys: Jaynee Eagles   Unit: [id number] Bed: 768-A  Discharge Information  Actual D/C Date: 09/27/2016  Actual LOS: 6  Discharge Disposition: SKILLED NURSING FACILITY MEDICARE CERTI (Code 03)  160. LOC Notification, CMS Important Message / Detailed Notice  Created by : Cindie Laroche Date/Time 2016-12-17 16:40:37.000  Medicare Important Message/Detailed Notice  Admission- CMS-Important Message from Medicare About Your Rights (CMS-1405) IMM was delivered to the patient. A copy was  provided to the patient. (Date / Time) 09/24/16 8:46am

## 2017-01-11 IMAGING — DX DG CHEST 2V
2 series · 2 of 2 positions shown · non-contrast
Comparison: Single view of the chest 12/08/2004. PA and lateral
chest 05/24/2003.

CLINICAL DATA: Shortness of breath for 2 days.  Initial encounter.

EXAM:
CHEST  2 VIEW

[w chest lat]
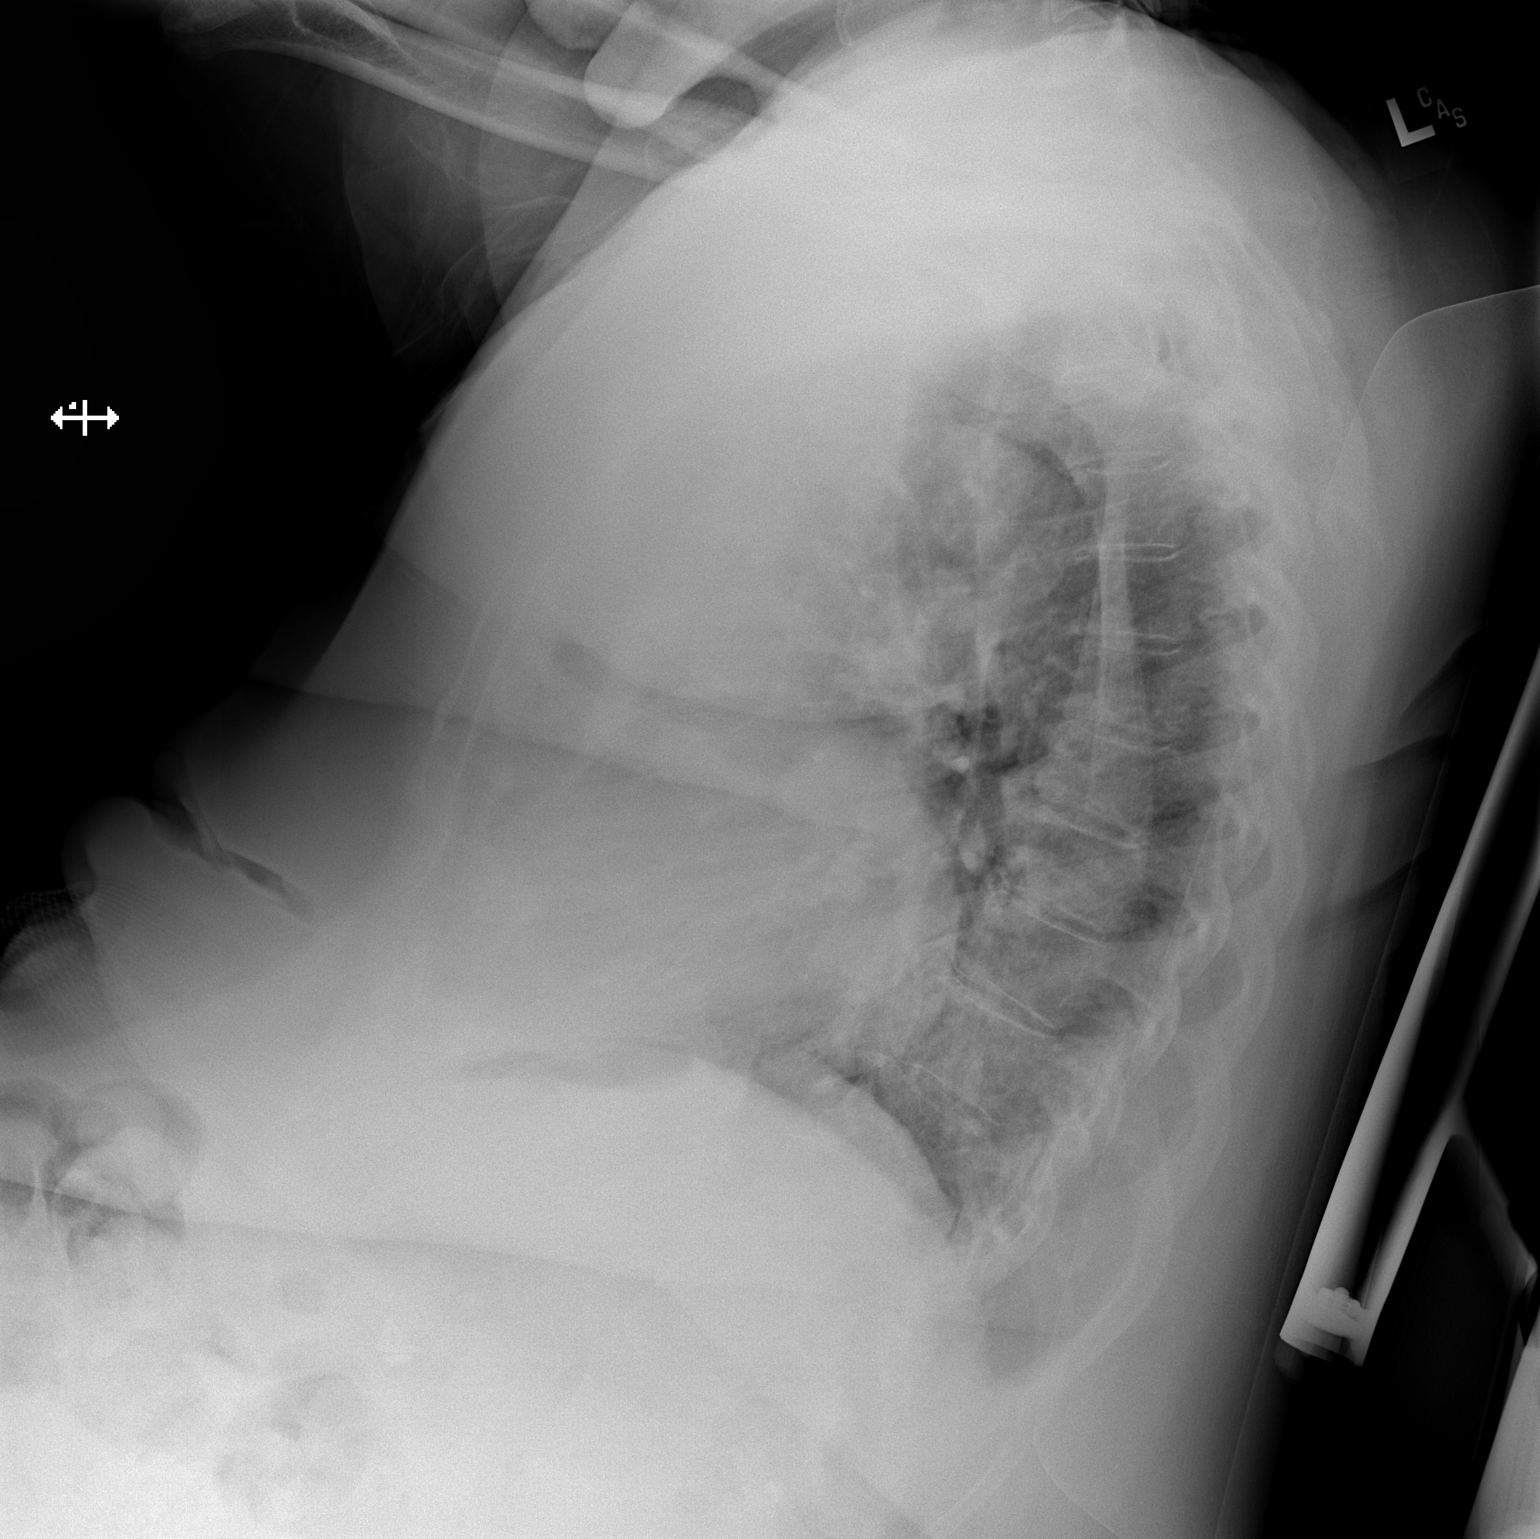

[x chest ap]
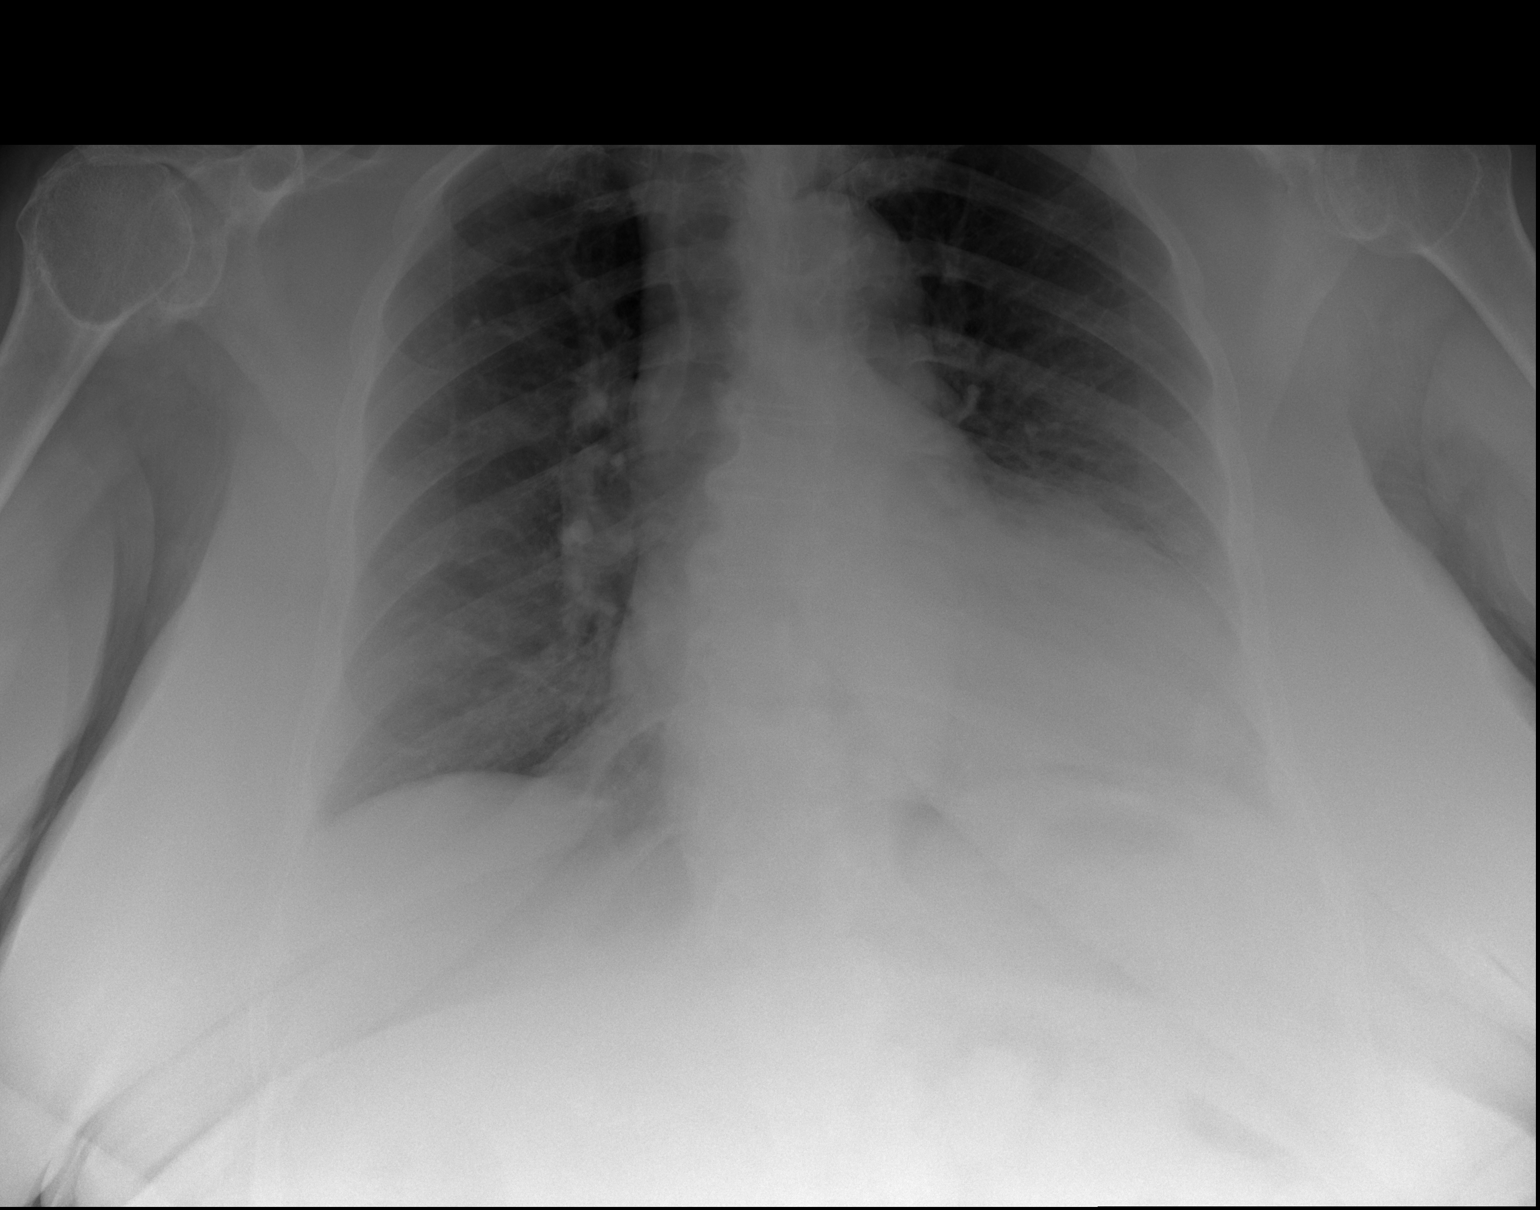

[2 of 2 positions shown; findings below may reference images not displayed]

FINDINGS: There is cardiomegaly without edema. The lungs are clear. No
pneumothorax or pleural effusion.
IMPRESSION: Cardiomegaly without acute disease.

## 2017-01-31 ENCOUNTER — Encounter (INDEPENDENT_AMBULATORY_CARE_PROVIDER_SITE_OTHER): Payer: Medicare Other | Admitting: Neurology

## 2020-05-11 DEATH — deceased
